# Patient Record
Sex: Female | Born: 1935 | Race: White | Hispanic: No | Marital: Married | Smoking: Never smoker
Health system: Southern US, Community
[De-identification: ages and names within clinical notes are randomized; demographics above are authoritative.]

---

## 1998-08-22 ENCOUNTER — Other Ambulatory Visit: Admission: RE | Admit: 1998-08-22 | Discharge: 1998-08-22 | Payer: Self-pay | Admitting: Obstetrics and Gynecology

## 1999-08-31 ENCOUNTER — Other Ambulatory Visit: Admission: RE | Admit: 1999-08-31 | Discharge: 1999-08-31 | Payer: Self-pay | Admitting: Obstetrics and Gynecology

## 1999-11-18 ENCOUNTER — Encounter: Payer: Self-pay | Admitting: Obstetrics and Gynecology

## 1999-11-18 ENCOUNTER — Encounter: Admission: RE | Admit: 1999-11-18 | Discharge: 1999-11-18 | Payer: Self-pay | Admitting: Obstetrics and Gynecology

## 2000-08-23 ENCOUNTER — Other Ambulatory Visit: Admission: RE | Admit: 2000-08-23 | Discharge: 2000-08-23 | Payer: Self-pay | Admitting: Obstetrics and Gynecology

## 2000-11-23 ENCOUNTER — Encounter: Admission: RE | Admit: 2000-11-23 | Discharge: 2000-11-23 | Payer: Self-pay | Admitting: Obstetrics and Gynecology

## 2000-11-23 ENCOUNTER — Encounter: Payer: Self-pay | Admitting: Obstetrics and Gynecology

## 2001-08-24 ENCOUNTER — Other Ambulatory Visit: Admission: RE | Admit: 2001-08-24 | Discharge: 2001-08-24 | Payer: Self-pay | Admitting: Obstetrics and Gynecology

## 2001-11-24 ENCOUNTER — Encounter: Payer: Self-pay | Admitting: Obstetrics and Gynecology

## 2001-11-24 ENCOUNTER — Encounter: Admission: RE | Admit: 2001-11-24 | Discharge: 2001-11-24 | Payer: Self-pay | Admitting: Obstetrics and Gynecology

## 2001-11-27 ENCOUNTER — Encounter: Admission: RE | Admit: 2001-11-27 | Discharge: 2001-11-27 | Payer: Self-pay | Admitting: Obstetrics and Gynecology

## 2001-11-27 ENCOUNTER — Encounter: Payer: Self-pay | Admitting: Obstetrics and Gynecology

## 2002-03-28 ENCOUNTER — Encounter: Admission: RE | Admit: 2002-03-28 | Discharge: 2002-06-26 | Payer: Self-pay | Admitting: Endocrinology

## 2002-10-09 ENCOUNTER — Other Ambulatory Visit: Admission: RE | Admit: 2002-10-09 | Discharge: 2002-10-09 | Payer: Self-pay | Admitting: Obstetrics and Gynecology

## 2002-11-26 ENCOUNTER — Encounter: Payer: Self-pay | Admitting: Obstetrics and Gynecology

## 2002-11-26 ENCOUNTER — Encounter: Admission: RE | Admit: 2002-11-26 | Discharge: 2002-11-26 | Payer: Self-pay | Admitting: Obstetrics and Gynecology

## 2003-10-10 ENCOUNTER — Other Ambulatory Visit: Admission: RE | Admit: 2003-10-10 | Discharge: 2003-10-10 | Payer: Self-pay | Admitting: Obstetrics and Gynecology

## 2003-11-27 ENCOUNTER — Encounter: Admission: RE | Admit: 2003-11-27 | Discharge: 2003-11-27 | Payer: Self-pay | Admitting: Obstetrics and Gynecology

## 2004-05-05 ENCOUNTER — Ambulatory Visit: Payer: Self-pay | Admitting: Endocrinology

## 2004-10-15 ENCOUNTER — Other Ambulatory Visit: Admission: RE | Admit: 2004-10-15 | Discharge: 2004-10-15 | Payer: Self-pay | Admitting: Addiction Medicine

## 2004-12-02 ENCOUNTER — Encounter: Admission: RE | Admit: 2004-12-02 | Discharge: 2004-12-02 | Payer: Self-pay | Admitting: Obstetrics and Gynecology

## 2005-12-03 ENCOUNTER — Encounter: Admission: RE | Admit: 2005-12-03 | Discharge: 2005-12-03 | Payer: Self-pay | Admitting: Internal Medicine

## 2006-12-06 ENCOUNTER — Encounter: Admission: RE | Admit: 2006-12-06 | Discharge: 2006-12-06 | Payer: Self-pay | Admitting: Internal Medicine

## 2007-01-26 ENCOUNTER — Other Ambulatory Visit: Admission: RE | Admit: 2007-01-26 | Discharge: 2007-01-26 | Payer: Self-pay | Admitting: Obstetrics and Gynecology

## 2007-06-26 ENCOUNTER — Emergency Department (HOSPITAL_COMMUNITY): Admission: EM | Admit: 2007-06-26 | Discharge: 2007-06-26 | Payer: Self-pay | Admitting: Emergency Medicine

## 2007-12-07 ENCOUNTER — Encounter: Admission: RE | Admit: 2007-12-07 | Discharge: 2007-12-07 | Payer: Self-pay | Admitting: Internal Medicine

## 2007-12-12 ENCOUNTER — Encounter: Admission: RE | Admit: 2007-12-12 | Discharge: 2007-12-12 | Payer: Self-pay | Admitting: Internal Medicine

## 2008-12-09 ENCOUNTER — Encounter: Admission: RE | Admit: 2008-12-09 | Discharge: 2008-12-09 | Payer: Self-pay | Admitting: Internal Medicine

## 2009-12-11 ENCOUNTER — Encounter: Admission: RE | Admit: 2009-12-11 | Discharge: 2009-12-11 | Payer: Self-pay | Admitting: Internal Medicine

## 2010-05-03 ENCOUNTER — Encounter: Payer: Self-pay | Admitting: Obstetrics and Gynecology

## 2010-05-04 ENCOUNTER — Encounter: Payer: Self-pay | Admitting: Internal Medicine

## 2010-12-23 ENCOUNTER — Other Ambulatory Visit: Payer: Self-pay | Admitting: Internal Medicine

## 2010-12-23 DIAGNOSIS — Z1231 Encounter for screening mammogram for malignant neoplasm of breast: Secondary | ICD-10-CM

## 2011-01-06 ENCOUNTER — Ambulatory Visit
Admission: RE | Admit: 2011-01-06 | Discharge: 2011-01-06 | Disposition: A | Discharge status: Home / Self Care | Payer: Medicare Other | Source: Ambulatory Visit | Attending: Internal Medicine | Admitting: Internal Medicine

## 2011-01-06 DIAGNOSIS — Z1231 Encounter for screening mammogram for malignant neoplasm of breast: Secondary | ICD-10-CM

## 2011-12-20 ENCOUNTER — Other Ambulatory Visit: Payer: Self-pay | Admitting: Internal Medicine

## 2011-12-20 DIAGNOSIS — Z1231 Encounter for screening mammogram for malignant neoplasm of breast: Secondary | ICD-10-CM

## 2012-01-11 ENCOUNTER — Ambulatory Visit
Admission: RE | Admit: 2012-01-11 | Discharge: 2012-01-11 | Disposition: A | Discharge status: Home / Self Care | Payer: Medicare Other | Source: Ambulatory Visit | Attending: Internal Medicine | Admitting: Internal Medicine

## 2012-01-11 DIAGNOSIS — Z1231 Encounter for screening mammogram for malignant neoplasm of breast: Secondary | ICD-10-CM

## 2012-12-19 ENCOUNTER — Other Ambulatory Visit: Payer: Self-pay

## 2012-12-19 DIAGNOSIS — Z1231 Encounter for screening mammogram for malignant neoplasm of breast: Secondary | ICD-10-CM

## 2013-01-16 ENCOUNTER — Ambulatory Visit
Admission: RE | Admit: 2013-01-16 | Discharge: 2013-01-16 | Disposition: A | Discharge status: Home / Self Care | Payer: Medicare Other | Source: Ambulatory Visit

## 2013-01-16 DIAGNOSIS — Z1231 Encounter for screening mammogram for malignant neoplasm of breast: Secondary | ICD-10-CM

## 2013-12-18 ENCOUNTER — Other Ambulatory Visit: Payer: Self-pay

## 2013-12-18 DIAGNOSIS — Z1231 Encounter for screening mammogram for malignant neoplasm of breast: Secondary | ICD-10-CM

## 2014-01-17 ENCOUNTER — Ambulatory Visit
Admission: RE | Admit: 2014-01-17 | Discharge: 2014-01-17 | Disposition: A | Discharge status: Home / Self Care | Payer: Medicare Other | Source: Ambulatory Visit

## 2014-01-17 DIAGNOSIS — Z1231 Encounter for screening mammogram for malignant neoplasm of breast: Secondary | ICD-10-CM

## 2014-12-30 ENCOUNTER — Other Ambulatory Visit: Payer: Self-pay

## 2014-12-30 DIAGNOSIS — Z1231 Encounter for screening mammogram for malignant neoplasm of breast: Secondary | ICD-10-CM

## 2015-01-22 ENCOUNTER — Ambulatory Visit
Admission: RE | Admit: 2015-01-22 | Discharge: 2015-01-22 | Disposition: A | Discharge status: Home / Self Care | Payer: Medicare Other | Source: Ambulatory Visit

## 2015-01-22 DIAGNOSIS — Z1231 Encounter for screening mammogram for malignant neoplasm of breast: Secondary | ICD-10-CM

## 2015-12-22 ENCOUNTER — Other Ambulatory Visit: Payer: Self-pay | Admitting: Internal Medicine

## 2015-12-22 DIAGNOSIS — Z1231 Encounter for screening mammogram for malignant neoplasm of breast: Secondary | ICD-10-CM

## 2016-01-26 ENCOUNTER — Ambulatory Visit
Admission: RE | Admit: 2016-01-26 | Discharge: 2016-01-26 | Disposition: A | Discharge status: Home / Self Care | Payer: Medicare Other | Source: Ambulatory Visit | Attending: Internal Medicine | Admitting: Internal Medicine

## 2016-01-26 DIAGNOSIS — Z1231 Encounter for screening mammogram for malignant neoplasm of breast: Secondary | ICD-10-CM

## 2016-10-16 ENCOUNTER — Encounter (HOSPITAL_COMMUNITY): Payer: Self-pay | Admitting: Nurse Practitioner

## 2016-10-16 ENCOUNTER — Emergency Department (HOSPITAL_COMMUNITY)
Admission: EM | Admit: 2016-10-16 | Discharge: 2016-10-16 | Disposition: A | Discharge status: Home / Self Care | Payer: Medicare Other | Attending: Emergency Medicine | Admitting: Emergency Medicine

## 2016-10-16 DIAGNOSIS — L299 Pruritus, unspecified: Secondary | ICD-10-CM | POA: Diagnosis not present

## 2016-10-16 DIAGNOSIS — Z7982 Long term (current) use of aspirin: Secondary | ICD-10-CM | POA: Diagnosis not present

## 2016-10-16 DIAGNOSIS — Z79899 Other long term (current) drug therapy: Secondary | ICD-10-CM | POA: Insufficient documentation

## 2016-10-16 DIAGNOSIS — L500 Allergic urticaria: Secondary | ICD-10-CM | POA: Diagnosis not present

## 2016-10-16 DIAGNOSIS — T7840XA Allergy, unspecified, initial encounter: Secondary | ICD-10-CM

## 2016-10-16 DIAGNOSIS — R21 Rash and other nonspecific skin eruption: Secondary | ICD-10-CM | POA: Diagnosis present

## 2016-10-16 DIAGNOSIS — L509 Urticaria, unspecified: Secondary | ICD-10-CM

## 2016-10-16 DIAGNOSIS — R490 Dysphonia: Secondary | ICD-10-CM | POA: Insufficient documentation

## 2016-10-16 MED ORDER — DIPHENHYDRAMINE HCL 12.5 MG/5ML PO ELIX: 25.0000 mg | ORAL_SOLUTION | Freq: Once | ORAL | Status: DC

## 2016-10-16 MED ORDER — DIPHENHYDRAMINE HCL 50 MG/ML IJ SOLN
25.0000 mg | Freq: Once | INTRAMUSCULAR | Status: AC
  Administered 2016-10-16: 25 mg via INTRAVENOUS
  Filled 2016-10-16: qty 1

## 2016-10-16 MED ORDER — DIPHENHYDRAMINE HCL 25 MG PO CAPS: 25.0000 mg | ORAL_CAPSULE | Freq: Four times a day (QID) | ORAL | 0 refills | Status: DC | PRN

## 2016-10-16 MED ORDER — FAMOTIDINE IN NACL 20-0.9 MG/50ML-% IV SOLN
20.0000 mg | Freq: Once | INTRAVENOUS | Status: AC
  Administered 2016-10-16: 20 mg via INTRAVENOUS
  Filled 2016-10-16: qty 50

## 2016-10-16 MED ORDER — PREDNISONE 10 MG PO TABS: 60.0000 mg | ORAL_TABLET | Freq: Every day | ORAL | 0 refills | Status: DC

## 2016-10-16 MED ORDER — METHYLPREDNISOLONE SODIUM SUCC 125 MG IJ SOLR
125.0000 mg | Freq: Once | INTRAMUSCULAR | Status: AC
  Administered 2016-10-16: 125 mg via INTRAVENOUS
  Filled 2016-10-16: qty 2

## 2016-10-16 NOTE — Discharge Instructions (Signed)
We saw you in the ER after you had the allergic reaction.  The reaction is severe, however, it appears to be in control and there is no increased swelling or any difficulty in breathing noted. We are not sure what caused the reaction, and it is important for you to follow up with an allergist. Please take the medications prescribed. PLEASE RETURN TO THE ER IMMEDIATELY IN CASE YOU START HAVING WORSENING SWELLING, DIFFICULTY IN BREATHING ETC.  

## 2016-10-16 NOTE — ED Triage Notes (Signed)
Pt states while laying in bed, she noticed she had been bitten by unknown insect at which point her skin turned red and flushed. She feel warm to touch and the skin color changes is rapidly progressing to lower extremities.

## 2016-10-16 NOTE — ED Provider Notes (Signed)
[facility]-EMERGENCY DEPT Provider Note   CSN: [id number] Arrival date & time: 10/16/16  0214   By signing my name below, I, Lori Valdez, attest that this documentation has been prepared under the direction and in the presence of Lori Kaplan, MD. Electronically Signed: Soijett Valdez, ED Scribe. 10/16/16. 2:45 AM.  History   Chief Complaint Chief Complaint  Patient presents with  . Allergic Reaction  . Insect Bite    HPI Lori Valdez is a 83 y.o. female who presents to the Emergency Department complaining of allergic reaction s/p possible insshect bite onset 1.5 hours ago. Pt reports associated hoarse voice, rash to generalized body, and redness to the affected areas. Pt has tried OTC anti-itch cream and vinegar with no relief of her symptoms. She notes that she woke up and noticed itching, redness, and a rash to her entire body. Denies hx of allergic reactions in the past. She denies trouble swallowing, nausea, SOB, wheezing, diaphoresis, and any other symptoms. Denies PMHx of DM.     The history is provided by the patient and the spouse. No language interpreter was used.    History reviewed. No pertinent past medical history.  There are no active problems to display for this patient.   History reviewed. No pertinent surgical history.  OB History    No data available       Home Medications    Prior to Admission medications   Medication Sig Start Date End Date Taking? Authorizing Provider  aspirin 81 MG chewable tablet Chew 81 mg by mouth every other day.    Yes [provider]  Multiple Vitamin (MULTIVITAMIN WITH MINERALS) TABS tablet Take 1 tablet by mouth daily.   Yes [provider]  Omega-3 Fatty Acids (FISH OIL) 1000 MG CAPS Take 1,000 mg by mouth daily.    Yes [provider]  simvastatin (ZOCOR) 20 MG tablet Take 20 mg by mouth daily at 6 PM.  09/06/16  Yes [provider]  diphenhydrAMINE (BENADRYL) 25 mg capsule Take 1 capsule  (25 mg total) by mouth every 6 (six) hours as needed for itching. 10/16/16   Lori Kaplan, MD  predniSONE (DELTASONE) 10 MG tablet Take 6 tablets (60 mg total) by mouth daily. 10/16/16   Lori Kaplan, MD    Family History History reviewed. No pertinent family history.  Social History Social History  Substance Use Topics  . Smoking status: Not on file  . Smokeless tobacco: Not on file  . Alcohol use Not on file     Allergies   Patient has no known allergies.   Review of Systems Review of Systems  HENT: Positive for voice change (hoarse). Negative for trouble swallowing.   Respiratory: Negative for shortness of breath and wheezing.   Gastrointestinal: Negative for nausea.  Skin: Positive for color change (redness to the affected areas) and rash (generalized body).  All other systems reviewed and are negative.    Physical Exam Updated Vital Signs BP 130/80 (BP Location: Left Arm)   Pulse 84   Temp 97.9 F (36.6 C) (Oral)   Resp 18   SpO2 96%   Physical Exam  Constitutional: She is oriented to person, place, and time. She appears well-developed and well-nourished. No distress.  HENT:  Head: Normocephalic and atraumatic.  No oral swelling appreciated.   Eyes: EOM are normal.  Neck: Neck supple.  Cardiovascular: Normal rate, regular rhythm and normal heart sounds.  Exam reveals no gallop and no friction rub.   No  murmur heard. Pulmonary/Chest: Effort normal and breath sounds normal. No respiratory distress. She has no wheezes. She has no rales.  Abdominal: Soft. Bowel sounds are normal. She exhibits no distension. There is no tenderness.  Musculoskeletal: Normal range of motion.  Neurological: She is alert and oriented to person, place, and time.  Skin: Skin is warm and dry. Rash noted. Rash is urticarial. There is erythema.  Urticarial, erythematous, blanching rash diffusely present over neck, torso, and proximal upper and lower extremities.   Psychiatric: She has a  normal mood and affect. Her behavior is normal.  Nursing note and vitals reviewed.    ED Treatments / Results  DIAGNOSTIC STUDIES: Oxygen Saturation is 96% on RA, nl by my interpretation.    COORDINATION OF CARE: 2:39 AM Discussed treatment plan with pt at bedside and pt agreed to plan.   Labs (all labs ordered are listed, but only abnormal results are displayed) Labs Reviewed - No data to display  EKG  EKG Interpretation None       Radiology No results found.  Procedures Procedures (including critical care time)  Medications Ordered in ED Medications  famotidine (PEPCID) IVPB 20 mg premix (0 mg Intravenous Stopped 10/16/16 0359)  methylPREDNISolone sodium succinate (SOLU-MEDROL) 125 mg/2 mL injection 125 mg (125 mg Intravenous Given 10/16/16 0324)  diphenhydrAMINE (BENADRYL) injection 25 mg (25 mg Intravenous Given 10/16/16 0324)     Initial Impression / Assessment and Plan / ED Course  I have reviewed the triage vital signs and the nursing notes.  Pertinent labs & imaging results that were available during my care of the patient were reviewed by me and considered in my medical decision making (see chart for details).  Clinical Course as of Oct 17 722  Sat Oct 16, 2016  0600 Pt at reassessment had complete clearance of her rash and she continues to have no dib, wheezing, dysphagia.. Will d/c with steroids. Strict ER return precautions have been discussed, and patient is agreeing with the plan and is comfortable with the workup done and the recommendations from the ER.   [AN]    Clinical Course User Index [AN] Lori KaplanNanavati, Maycen Degregory, MD    Pt comes in with cc of itching and skin rash. Pt has an urticarial rash, generalized and she feels that her voice is slightly hoarse - although she has no dib, dysphagia and no wheezing. Oral exam is normal. We will give steroids and benadryl and reassess.    Final Clinical Impressions(s) / ED Diagnoses   Final diagnoses:  Urticaria   Allergic reaction, initial encounter    New Prescriptions Discharge Medication List as of 10/16/2016  6:04 AM    START taking these medications   Details  diphenhydrAMINE (BENADRYL) 25 mg capsule Take 1 capsule (25 mg total) by mouth every 6 (six) hours as needed for itching., Starting Sat 10/16/2016, Print    predniSONE (DELTASONE) 10 MG tablet Take 6 tablets (60 mg total) by mouth daily., Starting Sat 10/16/2016, Print       I personally performed the services described in this documentation, which was scribed in my presence. The recorded information has been reviewed and is accurate.     Lori KaplanNanavati, Jlynn Langille, MD 10/16/16 [phone]

## 2016-10-16 NOTE — ED Notes (Signed)
PT DISCHARGED. INSTRUCTIONS AND PRESCRIPTIONS GIVEN. AAOX4. PT IN NO APPARENT DISTRESS OR PAIN. THE OPPORTUNITY TO ASK QUESTIONS WAS PROVIDED. 

## 2016-12-15 LAB — LIPID PANEL
CHOLESTEROL: 213 — AB (ref 0–200)
HDL: 77 — AB (ref 35–70)
LDL Cholesterol: 22
TRIGLYCERIDES: 111 (ref 40–160)

## 2016-12-15 LAB — BASIC METABOLIC PANEL
BUN: 12 (ref 4–21)
CREATININE: 0.9 (ref 0.5–1.1)
Glucose: 147
Potassium: 5 (ref 3.4–5.3)
Sodium: 143 (ref 137–147)

## 2016-12-15 LAB — CBC AND DIFFERENTIAL
HCT: 43 (ref 36–46)
HEMOGLOBIN: 13.8 (ref 12.0–16.0)
Platelets: 274 (ref 150–399)
WBC: 4.5

## 2016-12-15 LAB — HEPATIC FUNCTION PANEL
ALT: 17 (ref 7–35)
AST: 23 (ref 13–35)
Alkaline Phosphatase: 74 (ref 25–125)
Bilirubin, Total: 0.5

## 2016-12-15 LAB — HEMOGLOBIN A1C: HEMOGLOBIN A1C: 7.3 — AB (ref 4.0–6.0)

## 2016-12-20 ENCOUNTER — Other Ambulatory Visit: Payer: Self-pay | Admitting: Internal Medicine

## 2016-12-20 DIAGNOSIS — Z1231 Encounter for screening mammogram for malignant neoplasm of breast: Secondary | ICD-10-CM

## 2017-01-27 ENCOUNTER — Ambulatory Visit
Admission: RE | Admit: 2017-01-27 | Discharge: 2017-01-27 | Disposition: A | Discharge status: Home / Self Care | Payer: Medicare Other | Source: Ambulatory Visit | Attending: Internal Medicine | Admitting: Internal Medicine

## 2017-01-27 DIAGNOSIS — Z1231 Encounter for screening mammogram for malignant neoplasm of breast: Secondary | ICD-10-CM

## 2017-01-27 LAB — HM MAMMOGRAPHY

## 2017-12-20 ENCOUNTER — Ambulatory Visit (INDEPENDENT_AMBULATORY_CARE_PROVIDER_SITE_OTHER): Payer: Medicare Other | Admitting: Family Medicine

## 2017-12-20 ENCOUNTER — Ambulatory Visit: Payer: Medicare Other | Admitting: Family Medicine

## 2017-12-20 ENCOUNTER — Encounter: Payer: Self-pay | Admitting: Family Medicine

## 2017-12-20 VITALS — BP 112/68 | HR 64 | Temp 97.5°F | Ht 63.0 in | Wt 118.2 lb

## 2017-12-20 DIAGNOSIS — E119 Type 2 diabetes mellitus without complications: Secondary | ICD-10-CM | POA: Diagnosis not present

## 2017-12-20 DIAGNOSIS — E7800 Pure hypercholesterolemia, unspecified: Secondary | ICD-10-CM

## 2017-12-20 LAB — HEMOGLOBIN A1C: Hgb A1c MFr Bld: 7.4 % — ABNORMAL HIGH (ref 4.6–6.5)

## 2017-12-20 LAB — URINALYSIS, ROUTINE W REFLEX MICROSCOPIC
Bilirubin Urine: NEGATIVE
Hgb urine dipstick: NEGATIVE
Ketones, ur: NEGATIVE
Leukocytes, UA: NEGATIVE
Nitrite: NEGATIVE
RBC / HPF: NONE SEEN (ref 0–?)
Specific Gravity, Urine: <=1.005 — AB (ref 1.000–1.030)
Total Protein, Urine: NEGATIVE
Urine Glucose: NEGATIVE
Urobilinogen, UA: 0.2 (ref 0.0–1.0)
pH: 6.5 (ref 5.0–8.0)

## 2017-12-20 LAB — COMPREHENSIVE METABOLIC PANEL WITH GFR
ALT: 21 U/L (ref 0–35)
AST: 20 U/L (ref 0–37)
Albumin: 4.5 g/dL (ref 3.5–5.2)
Alkaline Phosphatase: 65 U/L (ref 39–117)
BUN: 14 mg/dL (ref 6–23)
CO2: 28 meq/L (ref 19–32)
Calcium: 9.8 mg/dL (ref 8.4–10.5)
Chloride: 104 meq/L (ref 96–112)
Creatinine, Ser: 0.78 mg/dL (ref 0.40–1.20)
GFR: 74.99 mL/min (ref >60.00)
Glucose, Bld: 121 mg/dL — ABNORMAL HIGH (ref 70–99)
Potassium: 4.2 meq/L (ref 3.5–5.1)
Sodium: 139 meq/L (ref 135–145)
Total Bilirubin: 0.6 mg/dL (ref 0.2–1.2)
Total Protein: 7.3 g/dL (ref 6.0–8.3)

## 2017-12-20 LAB — MICROALBUMIN / CREATININE URINE RATIO
Creatinine,U: 29.7 mg/dL
Microalb Creat Ratio: 2.4 mg/g (ref 0.0–30.0)
Microalb, Ur: <0.7 mg/dL (ref 0.0–1.9)

## 2017-12-20 LAB — CBC
HEMATOCRIT: 39.7 % (ref 36.0–46.0)
HEMOGLOBIN: 13.3 g/dL (ref 12.0–15.0)
MCHC: 33.4 g/dL (ref 30.0–36.0)
MCV: 95.2 fl (ref 78.0–100.0)
Platelets: 228 10*3/uL (ref 150.0–400.0)
RBC: 4.17 Mil/uL (ref 3.87–5.11)
RDW: 12.9 % (ref 11.5–15.5)
WBC: 4.7 10*3/uL (ref 4.0–10.5)

## 2017-12-20 LAB — LIPID PANEL
Cholesterol: 213 mg/dL — ABNORMAL HIGH (ref 0–200)
HDL: 67.2 mg/dL (ref >39.00)
LDL Cholesterol: 117 mg/dL — ABNORMAL HIGH (ref 0–99)
NonHDL: 146.07
Total CHOL/HDL Ratio: 3
Triglycerides: 144 mg/dL (ref 0.0–149.0)
VLDL: 28.8 mg/dL (ref 0.0–40.0)

## 2017-12-20 NOTE — Patient Instructions (Signed)
Type 2 Diabetes Mellitus, Self Care, Adult Caring for yourself after you have been diagnosed with type 2 diabetes (type 2 diabetes mellitus) means keeping your blood sugar (glucose) under control with a balance of:  Nutrition.  Exercise.  Lifestyle changes.  Medicines or insulin, if necessary.  Support from your team of health care providers and others.  The following information explains what you need to know to manage your diabetes at home. What do I need to do to manage my blood glucose?  Check your blood glucose every day, as often as told by your health care provider.  Contact your health care provider if your blood glucose is above your target for 2 tests in a row.  Have your A1c (hemoglobin A1c) level checked at least two times a year, or as often as told by your health care provider. Your health care provider will set individualized treatment goals for you. Generally, the goal of treatment is to maintain the following blood glucose levels:  Before meals (preprandial): 80-130 mg/dL (4.4-7.2 mmol/L).  After meals (postprandial): below 180 mg/dL (10 mmol/L).  A1c level: less than 7%.  What do I need to know about hyperglycemia and hypoglycemia? What is hyperglycemia? Hyperglycemia, also called high blood glucose, occurs when blood glucose is too high.Make sure you know the early signs of hyperglycemia, such as:  Increased thirst.  Hunger.  Feeling very tired.  Needing to urinate more often than usual.  Blurry vision.  What is hypoglycemia? Hypoglycemia, also called low blood glucose, occurswith a blood glucose level at or below 70 mg/dL (3.9 mmol/L). The risk for hypoglycemia increases during or after exercise, during sleep, during illness, and when skipping meals or not eating for a long time (fasting). It is important to know the symptoms of hypoglycemia and treat it right away. Always have a 15-gram rapid-acting carbohydrate snack with you to treat low blood  glucose. Family members and close friends should also know the symptoms and should understand how to treat hypoglycemia, in case you are not able to treat yourself. What are the symptoms of hypoglycemia? Hypoglycemia symptoms can include:  Hunger.  Anxiety.  Sweating and feeling clammy.  Confusion.  Dizziness or feeling light-headed.  Sleepiness.  Nausea.  Increased heart rate.  Headache.  Blurry vision.  Seizure.  Nightmares.  Tingling or numbness around the mouth, lips, or tongue.  A change in speech.  Decreased ability to concentrate.  A change in coordination.  Restless sleep.  Tremors or shakes.  Fainting.  Irritability.  How do I treat hypoglycemia?  If you are alert and able to swallow safely, follow the 15:15 rule:  Take 15 grams of a rapid-acting carbohydrate. Rapid-acting options include: ? 1 tube of glucose gel. ? 3 glucose pills. ? 6-8 pieces of hard candy. ? 4 oz (120 mL) of fruit juice. ? 4 oz (120 mL) of regular (not diet) soda.  Check your blood glucose 15 minutes after you take the carbohydrate.  If the repeat blood glucose level is still at or below 70 mg/dL (3.9 mmol/L), take 15 grams of a carbohydrate again.  If your blood glucose level does not increase above 70 mg/dL (3.9 mmol/L) after 3 tries, seek emergency medical care.  After your blood glucose level returns to normal, eat a meal or a snack within 1 hour.  How do I treat severe hypoglycemia? Severe hypoglycemia is when your blood glucose level is at or below 54 mg/dL (3 mmol/L). Severe hypoglycemia is an emergency. Do not  wait to see if the symptoms will go away. Get medical help right away. Call your local emergency services (911 in the [place] Do not drive yourself to the hospital. If you have severe hypoglycemia and you cannot eat or drink, you may need an injection of glucagon. A family member or close friend should learn how to check your blood glucose and how to give you  a glucagon injection. Ask your health care provider if you need to have an emergency glucagon injection kit available. Severe hypoglycemia may need to be treated in a hospital. The treatment may include getting glucose through an IV tube. You may also need treatment for the cause of your hypoglycemia. Can having diabetes put me at risk for other conditions? Having diabetes can put you at risk for other long-term (chronic) conditions, such as heart disease and kidney disease. Your health care provider may prescribe medicines to help prevent complications from diabetes. These medicines may include:  Aspirin.  Medicine to lower cholesterol.  Medicine to control blood pressure.  What else can I do to manage my diabetes? Take your diabetes medicines as told  If your health care provider prescribed insulin or diabetes medicines, take them every day.  Do not run out of insulin or other diabetes medicines that you take. Plan ahead so you always have these available.  If you use insulin, adjust your dosage based on how physically active you are and what foods you eat. Your health care provider will tell you how to adjust your dosage. Make healthy food choices  The things that you eat and drink affect your blood glucose and your insulin dosage. Making good choices helps to control your diabetes and prevent other health problems. A healthy meal plan includes eating lean proteins, complex carbohydrates, fresh fruits and vegetables, low-fat dairy products, and healthy fats. Make an appointment to see a diet and nutrition specialist (registered dietitian) to help you create an eating plan that is right for you. Make sure that you:  Follow instructions from your health care provider about eating or drinking restrictions.  Drink enough fluid to keep your urine clear or pale yellow.  Eat healthy snacks between nutritious meals.  Track the carbohydrates that you eat. Do this by reading food labels and  learning the standard serving sizes of foods.  Follow your sick day plan whenever you cannot eat or drink as usual. Make this plan in advance with your health care provider.  Stay active  Exercise regularly, as told by your health care provider. This may include:  Stretching and doing strength exercises, such as yoga or weightlifting, at least 2 times a week.  Doing at least 150 minutes of moderate-intensity or vigorous-intensity exercise each week. This could be brisk walking, biking, or water aerobics. ? Spread out your activity over at least 3 days of the week. ? Do not go more than 2 days in a row without doing some kind of physical activity.  When you start a new exercise or activity, work with your health care provider to adjust your insulin, medicines, or food intake as needed. Make healthy lifestyle choices  Do not use any tobacco products, such as cigarettes, chewing tobacco, and e-cigarettes. If you need help quitting, ask your health care provider.  If your health care provider says that alcohol is safe for you, limit alcohol intake to no more than 1 drink per day for nonpregnant women and 2 drinks per day for men. One drink equals 12 oz of  beer, 5 oz of wine, or 1 oz of hard liquor.  Learn to manage stress. If you need help with this, ask your health care provider. Care for your body   Keep your immunizations up to date. In addition to getting vaccinations as told by your health care provider, it is recommended that you get vaccinated against the following illnesses: ? The flu (influenza). Get a flu shot every year. ? Pneumonia. ? Hepatitis B.  Schedule an eye exam soon after your diagnosis, and then one time every year after that.  Check your skin and feet every day for cuts, bruises, redness, blisters, or sores. Schedule a foot exam with your health care provider once every year.  Brush your teeth and gums two times a day, and floss at least one time a day. Visit your  dentist at least once every 6 months.  Maintain a healthy weight. General instructions  Take over-the-counter and prescription medicines only as told by your health care provider.  Share your diabetes management plan with people in your workplace, school, and household.  Check your urine for ketones when you are ill and as told by your health care provider.  Ask your health care provider: ? Do I need to meet with a diabetes educator? ? Where can I find a support group for people with diabetes?  Carry a medical alert card or wear medical alert jewelry.  Keep all follow-up visits as told by your health care provider. This is important. Where to find more information: For more information about diabetes, visit:  American Diabetes Association (ADA): www.diabetes.org  American Association of Diabetes Educators (AADE): www.diabeteseducator.org/patient-resources  This information is not intended to replace advice given to you by your health care provider. Make sure you discuss any questions you have with your health care provider. Document Released: 07/21/2015 Document Revised: 09/04/2015 Document Reviewed: 05/02/2015 Elsevier Interactive Patient Education  [employer].

## 2017-12-20 NOTE — Progress Notes (Signed)
Subjective:  Patient ID: Lori Valdez, female    DOB: 01-Jul-1932  Age: 84 y.o. MRN: [id number]  CC: Establish Care (CPE, not fasting ( light breakfast))   HPI Amore Ackman Garverick presents for follow-up of her elevated cholesterol.  She is taking Zocor for years.  Her mother had high cholesterol.  She is tolerating this okay her core and does take it in the evening.  She brings in labs from last year that showed an LDL of 111 and an HDL of 77.  He has never had a heart attack or stroke hemoglobin A1c was elevated with that lab work.  She has no prior history of diabetes.  She is a retired Dietitian and stays active by going to a [employer] and doing exercise classes.  She is been her entire career working at the [employer] in [place].  She is married and her husband is a patient of mine as well.  She does not share his same last name.  She has 2 alcoholic drinks with her husband nightly.  Outpatient Medications Prior to Visit  Medication Sig Dispense Refill  . diphenhydrAMINE (BENADRYL) 25 mg capsule Take 1 capsule (25 mg total) by mouth every 6 (six) hours as needed for itching. 30 capsule 0  . Multiple Vitamin (MULTIVITAMIN WITH MINERALS) TABS tablet Take 1 tablet by mouth daily.    . Omega-3 Fatty Acids (FISH OIL) 1000 MG CAPS Take 1,000 mg by mouth daily.     . simvastatin (ZOCOR) 20 MG tablet Take 20 mg by mouth daily at 6 PM.     . aspirin 81 MG chewable tablet Chew 81 mg by mouth every other day.     . predniSONE (DELTASONE) 10 MG tablet Take 6 tablets (60 mg total) by mouth daily. (Patient not taking: Reported on 12/20/2017) 18 tablet 0  . simvastatin (ZOCOR) 10 MG tablet Take by mouth.     No facility-administered medications prior to visit.     ROS Review of Systems  Constitutional: Negative for chills, fatigue, fever and unexpected weight change.  HENT: Negative.   Eyes: Negative.   Respiratory: Negative.   Cardiovascular: Negative.     Gastrointestinal: Negative.   Endocrine: Negative for polyphagia and polyuria.  Genitourinary: Negative.   Musculoskeletal: Negative for gait problem and joint swelling.  Skin: Negative for rash.  Allergic/Immunologic: Negative for immunocompromised state.  Neurological: Negative.   Hematological: Negative.   Psychiatric/Behavioral: Negative.     Objective:  BP 112/68 (BP Location: Left Arm, Patient Position: Sitting, Cuff Size: Normal)   Pulse 64   Temp (!) 97.5 F (36.4 C) (Oral)   Ht 5\' 3"  (1.6 m)   Wt 118 lb 3.2 oz (53.6 kg)   SpO2 95%   BMI 20.94 kg/m   BP Readings from Last 3 Encounters:  12/20/17 112/68  10/16/16 138/83    Wt Readings from Last 3 Encounters:  12/20/17 118 lb 3.2 oz (53.6 kg)  10/16/16 118 lb (53.5 kg)    Physical Exam  Constitutional: She is oriented to person, place, and time. She appears well-developed and well-nourished. No distress.  HENT:  Head: Normocephalic and atraumatic.  Right Ear: External ear normal.  Left Ear: External ear normal.  Mouth/Throat: Oropharynx is clear and moist. No oropharyngeal exudate.  Eyes: Pupils are equal, round, and reactive to light. Conjunctivae and EOM are normal. Right eye exhibits no discharge. Left eye exhibits no discharge. No scleral icterus.  Neck: No  JVD present. No tracheal deviation present.  Cardiovascular: Normal rate, regular rhythm and normal heart sounds.  Pulmonary/Chest: Effort normal and breath sounds normal.  Neurological: She is alert and oriented to person, place, and time.  Skin: Skin is warm and dry. She is not diaphoretic.  Psychiatric: She has a normal mood and affect. Her behavior is normal.    Lab Results  Component Value Date   WBC 4.7 12/20/2017   HGB 13.3 12/20/2017   HCT 39.7 12/20/2017   PLT 228.0 12/20/2017   GLUCOSE 121 (H) 12/20/2017   CHOL 213 (H) 12/20/2017   TRIG 144.0 12/20/2017   HDL 67.20 12/20/2017   LDLCALC 117 (H) 12/20/2017   ALT 21 12/20/2017   AST 20  12/20/2017   NA 139 12/20/2017   K 4.2 12/20/2017   CL 104 12/20/2017   CREATININE 0.78 12/20/2017   BUN 14 12/20/2017   CO2 28 12/20/2017   HGBA1C 7.4 (H) 12/20/2017   MICROALBUR <0.7 12/20/2017    Mm Digital Screening Bilateral  Result Date: 01/27/2017 CLINICAL DATA:  Screening. EXAM: DIGITAL SCREENING BILATERAL MAMMOGRAM WITH CAD COMPARISON:  Previous exam(s). ACR Breast Density Category c: The breast tissue is heterogeneously dense, which may obscure small masses. FINDINGS: There are no findings suspicious for malignancy. Images were processed with CAD. IMPRESSION: No mammographic evidence of malignancy. A result letter of this screening mammogram will be mailed directly to the patient. RECOMMENDATION: Screening mammogram in one year. (Code:SM-B-01Y) BI-RADS CATEGORY  1: Negative. Electronically Signed   By: Ted Mcalpine M.D.   On: 01/27/2017 13:25    Assessment & Plan:   Myers was seen today for establish care.  Diagnoses and all orders for this visit:  Elevated LDL cholesterol level -     Comprehensive metabolic panel -     Lipid panel  Diabetes mellitus without complication (HCC) -     CBC -     Comprehensive metabolic panel -     Hemoglobin A1c -     Microalbumin / creatinine urine ratio -     Urinalysis, Routine w reflex microscopic -     metFORMIN (GLUCOPHAGE-XR) 500 MG 24 hr tablet; Take 1 tablet (500 mg total) by mouth at bedtime. -     Ambulatory referral to diabetic education   I am having Annalyce C. Rump start on metFORMIN. I am also having her maintain her simvastatin, aspirin, Fish Oil, multivitamin with minerals, diphenhydrAMINE, predniSONE, and simvastatin.  Meds ordered this encounter  Medications  . metFORMIN (GLUCOPHAGE-XR) 500 MG 24 hr tablet    Sig: Take 1 tablet (500 mg total) by mouth at bedtime.    Dispense:  90 tablet    Refill:  1   He is 3 hours fasting today.  Discussed using Glucophage the persistently elevated hemoglobin A1c.   Follow-up will be in 3 months.  Anticipatory guidance was given about diabetes.  Patient rarely consumes sweets and does not have sweet sugary drinks.  Follow-up: Return in about 3 months (around 03/21/2018).  Mliss Sax, MD

## 2017-12-21 MED ORDER — METFORMIN HCL ER 500 MG PO TB24: 500.0000 mg | ORAL_TABLET | Freq: Every day | ORAL | 1 refills | Status: DC

## 2017-12-27 ENCOUNTER — Encounter: Payer: Self-pay | Admitting: Family Medicine

## 2017-12-29 ENCOUNTER — Other Ambulatory Visit: Payer: Self-pay | Admitting: Family Medicine

## 2017-12-29 DIAGNOSIS — Z1231 Encounter for screening mammogram for malignant neoplasm of breast: Secondary | ICD-10-CM

## 2018-02-01 ENCOUNTER — Ambulatory Visit
Admission: RE | Admit: 2018-02-01 | Discharge: 2018-02-01 | Disposition: A | Discharge status: Home / Self Care | Payer: Medicare Other | Source: Ambulatory Visit | Attending: Family Medicine | Admitting: Family Medicine

## 2018-02-01 DIAGNOSIS — Z1231 Encounter for screening mammogram for malignant neoplasm of breast: Secondary | ICD-10-CM

## 2018-03-02 ENCOUNTER — Ambulatory Visit (INDEPENDENT_AMBULATORY_CARE_PROVIDER_SITE_OTHER): Payer: Medicare Other | Admitting: Behavioral Health

## 2018-03-02 DIAGNOSIS — Z23 Encounter for immunization: Secondary | ICD-10-CM | POA: Diagnosis not present

## 2018-03-02 NOTE — Progress Notes (Signed)
Patient came in clinic today for Influenza vaccination. IM injection was given in the right deltoid. Patient tolerated the injection well. No s/s of a reaction were noted prior to patient leaving the nurse visit.

## 2018-05-10 ENCOUNTER — Encounter: Payer: Self-pay | Admitting: Family Medicine

## 2018-05-11 MED ORDER — SIMVASTATIN 20 MG PO TABS: 20.0000 mg | ORAL_TABLET | Freq: Every day | ORAL | 2 refills | Status: DC

## 2018-07-11 ENCOUNTER — Other Ambulatory Visit: Payer: Self-pay | Admitting: Family Medicine

## 2018-07-11 DIAGNOSIS — E119 Type 2 diabetes mellitus without complications: Secondary | ICD-10-CM

## 2018-12-06 ENCOUNTER — Encounter: Payer: Self-pay | Admitting: Family Medicine

## 2018-12-09 ENCOUNTER — Encounter: Payer: Self-pay | Admitting: Family Medicine

## 2018-12-11 ENCOUNTER — Ambulatory Visit (INDEPENDENT_AMBULATORY_CARE_PROVIDER_SITE_OTHER): Payer: Medicare Other | Admitting: Family Medicine

## 2018-12-11 ENCOUNTER — Other Ambulatory Visit: Payer: Self-pay

## 2018-12-11 ENCOUNTER — Encounter: Payer: Self-pay | Admitting: Family Medicine

## 2018-12-11 DIAGNOSIS — Z20822 Contact with and (suspected) exposure to covid-19: Secondary | ICD-10-CM

## 2018-12-11 DIAGNOSIS — R509 Fever, unspecified: Secondary | ICD-10-CM

## 2018-12-11 DIAGNOSIS — Z20828 Contact with and (suspected) exposure to other viral communicable diseases: Secondary | ICD-10-CM | POA: Diagnosis not present

## 2018-12-11 NOTE — Progress Notes (Signed)
Established Patient Office Visit  Subjective:  Patient ID: Lori Valdez, female    DOB: Jan 23, 1933  Age: 85 y.o. MRN: [id number]  CC:  Chief Complaint  Patient presents with  . Fever    HPI Kadajah Kjos Spangler presents for evaluation and treatment of a 3-day history of elevated temperature with fatigue.  Patient denies any respiratory tract symptoms to include nasal congestion postnasal drip cough or shortness of breath.  Her smell and taste senses remain intact.  She is having few scattered intermittent myalgias and arthralgias.  Her husband is not affected.  She had a flu shot 10 days ago.  She and her husband shelter at home but they do go to the grocery store and went to [facility] for their flu shots.  She has no asthma history and never smoked.  History reviewed. No pertinent past medical history.  History reviewed. No pertinent surgical history.  History reviewed. No pertinent family history.  Social History   Socioeconomic History  . Marital status: Widowed    Spouse name: Not on file  . Number of children: Not on file  . Years of education: Not on file  . Highest education level: Not on file  Occupational History  . Not on file  Social Needs  . Financial resource strain: Not on file  . Food insecurity    Worry: Not on file    Inability: Not on file  . Transportation needs    Medical: Not on file    Non-medical: Not on file  Tobacco Use  . Smoking status: Never Smoker  . Smokeless tobacco: Never Used  Substance and Sexual Activity  . Alcohol use: Yes    Comment: drinks 2 scotch and waters nightly  . Drug use: Not on file  . Sexual activity: Not on file  Lifestyle  . Physical activity    Days per week: Not on file    Minutes per session: Not on file  . Stress: Not on file  Relationships  . Social Herbalist on phone: Not on file    Gets together: Not on file    Attends religious service: Not on file    Active member of club or organization: Not  on file    Attends meetings of clubs or organizations: Not on file    Relationship status: Not on file  . Intimate partner violence    Fear of current or ex partner: Not on file    Emotionally abused: Not on file    Physically abused: Not on file    Forced sexual activity: Not on file  Other Topics Concern  . Not on file  Social History Narrative  . Not on file    Outpatient Medications Prior to Visit  Medication Sig Dispense Refill  . aspirin 81 MG chewable tablet Chew 81 mg by mouth every other day.     . diphenhydrAMINE (BENADRYL) 25 mg capsule Take 1 capsule (25 mg total) by mouth every 6 (six) hours as needed for itching. 30 capsule 0  . metFORMIN (GLUCOPHAGE-XR) 500 MG 24 hr tablet TAKE 1 TABLET BY MOUTH AT  BEDTIME 90 tablet 1  . Multiple Vitamin (MULTIVITAMIN WITH MINERALS) TABS tablet Take 1 tablet by mouth daily.    . Omega-3 Fatty Acids (FISH OIL) 1000 MG CAPS Take 1,000 mg by mouth daily.     . simvastatin (ZOCOR) 20 MG tablet Take 1 tablet (20 mg total) by mouth daily at 6 PM. 90  tablet 2  . predniSONE (DELTASONE) 10 MG tablet Take 6 tablets (60 mg total) by mouth daily. (Patient not taking: Reported on 12/20/2017) 18 tablet 0   No facility-administered medications prior to visit.     No Known Allergies  ROS Review of Systems  Constitutional: Positive for fatigue. Negative for chills, diaphoresis, fever and unexpected weight change.  HENT: Negative for congestion, dental problem, postnasal drip, rhinorrhea and sore throat.   Eyes: Negative for photophobia and visual disturbance.  Respiratory: Negative for cough, choking and wheezing.   Cardiovascular: Negative for chest pain and palpitations.  Gastrointestinal: Negative for abdominal pain, diarrhea, nausea and vomiting.  Endocrine: Negative for polyphagia and polyuria.  Genitourinary: Negative for dysuria, frequency and urgency.  Musculoskeletal: Positive for arthralgias and myalgias.  Skin: Negative for pallor and  rash.  Allergic/Immunologic: Negative for immunocompromised state.  Neurological: Negative for light-headedness, numbness and headaches.  Hematological: Does not bruise/bleed easily.  Psychiatric/Behavioral: Negative.       Objective:    Physical Exam  Constitutional: She is oriented to person, place, and time. She appears well-developed and well-nourished. No distress.  HENT:  Head: Normocephalic and atraumatic.  Right Ear: External ear normal.  Left Ear: External ear normal.  Eyes: Conjunctivae are normal. Right eye exhibits no discharge. Left eye exhibits no discharge. No scleral icterus.  Neck: No JVD present. No tracheal deviation present.  Pulmonary/Chest: Effort normal. No stridor.  Neurological: She is alert and oriented to person, place, and time.  Skin: Skin is warm and dry. She is not diaphoretic.  Psychiatric: She has a normal mood and affect. Her behavior is normal.    There were no vitals taken for this visit. Wt Readings from Last 3 Encounters:  12/20/17 118 lb 3.2 oz (53.6 kg)  10/16/16 118 lb (53.5 kg)   BP Readings from Last 3 Encounters:  12/20/17 112/68  10/16/16 138/83   Guideline developer:  UpToDate (see UpToDate for funding source) Date Released: June 2014  Health Maintenance Due  Topic Date Due  . FOOT EXAM  02/04/1945  . OPHTHALMOLOGY EXAM  02/04/1945  . TETANUS/TDAP  02/04/1954  . DEXA SCAN  02/05/2000  . PNA vac Low Risk Adult (1 of 2 - PCV13) 02/05/2000  . HEMOGLOBIN A1C  06/20/2018  . URINE MICROALBUMIN  12/21/2018    There are no preventive care reminders to display for this patient.  No results found for: TSH Lab Results  Component Value Date   WBC 4.7 12/20/2017   HGB 13.3 12/20/2017   HCT 39.7 12/20/2017   MCV 95.2 12/20/2017   PLT 228.0 12/20/2017   Lab Results  Component Value Date   NA 139 12/20/2017   K 4.2 12/20/2017   CO2 28 12/20/2017   GLUCOSE 121 (H) 12/20/2017   BUN 14 12/20/2017   CREATININE 0.78 12/20/2017    BILITOT 0.6 12/20/2017   ALKPHOS 65 12/20/2017   AST 20 12/20/2017   ALT 21 12/20/2017   PROT 7.3 12/20/2017   ALBUMIN 4.5 12/20/2017   CALCIUM 9.8 12/20/2017   GFR 74.99 12/20/2017   Lab Results  Component Value Date   CHOL 213 (H) 12/20/2017   Lab Results  Component Value Date   HDL 67.20 12/20/2017   Lab Results  Component Value Date   LDLCALC 117 (H) 12/20/2017   Lab Results  Component Value Date   TRIG 144.0 12/20/2017   Lab Results  Component Value Date   CHOLHDL 3 12/20/2017   Lab Results  Component  Value Date   HGBA1C 7.4 (H) 12/20/2017      Assessment & Plan:   Problem List Items Addressed This Visit    None    Visit Diagnoses    Fever, unspecified fever cause    -  Primary   Relevant Orders   Novel Coronavirus, NAA ([facility])      No orders of the defined types were placed in this encounter.   Follow-up: Return in about 4 days (around 12/15/2018).    Patient advised to use over-the-counter cough and cold medicines with Tylenol.  Advised to be particularly vigilant for any shortness of breath or difficulty breathing.  Follow-up for recheck in 4 days.    Virtual Visit via Video Note  I connected with Yisel Schoolman Luebbe on 12/11/18 at  1:30 PM EDT by a video enabled telemedicine application and verified that I am speaking with the correct person using two identifiers.  Location: Patient: home Provider:    I discussed the limitations of evaluation and management by telemedicine and the availability of in person appointments. The patient expressed understanding and agreed to proceed.  History of Present Illness:    Observations/Objective:   Assessment and Plan:   Follow Up Instructions:    I discussed the assessment and treatment plan with the patient. The patient was provided an opportunity to ask questions and all were answered. The patient agreed with the plan and demonstrated an understanding of the instructions.   The patient was  advised to call back or seek an in-person evaluation if the symptoms worsen or if the condition fails to improve as anticipated.  I provided 20 minutes of non-face-to-face time during this encounter.   Mliss Sax, MD

## 2018-12-12 LAB — NOVEL CORONAVIRUS, NAA: SARS-CoV-2, NAA: NOT DETECTED

## 2018-12-15 ENCOUNTER — Telehealth: Payer: Self-pay

## 2018-12-15 ENCOUNTER — Encounter: Payer: Self-pay | Admitting: Family Medicine

## 2018-12-15 ENCOUNTER — Ambulatory Visit (INDEPENDENT_AMBULATORY_CARE_PROVIDER_SITE_OTHER): Payer: Medicare Other | Admitting: Family Medicine

## 2018-12-15 DIAGNOSIS — R509 Fever, unspecified: Secondary | ICD-10-CM | POA: Diagnosis not present

## 2018-12-15 NOTE — Telephone Encounter (Signed)

## 2018-12-15 NOTE — Progress Notes (Signed)
Established Patient Office Visit  Subjective:  Patient ID: Lori Valdez, female    DOB: [birth year]  Age: 85 y.o. MRN: [id number]  CC:  Chief Complaint  Patient presents with  . Follow-up    covid test negative, still having fever, sleepiness, and loss of appetite    HPI Al DecantJuliana C Valdez presents for follow-up of ongoing malaise and fatigue with decreased appetite.  Patient's temperature is down to 98 but she says this is actually a little bit high for her.  She continues to sleep a lot.  She is hydrating well with clear urine.  She has not had a bowel movement in a few days because she has not been eating.  She denies respiratory tract symptoms rashes back pain abdominal pain or dysuria.  She has no myalgias or arthralgias.  There is no rash on her hands and feet.  History reviewed. No pertinent past medical history.  History reviewed. No pertinent surgical history.  History reviewed. No pertinent family history.  Social History   Socioeconomic History  . Marital status: Widowed    Spouse name: Not on file  . Number of children: Not on file  . Years of education: Not on file  . Highest education level: Not on file  Occupational History  . Not on file  Social Needs  . Financial resource strain: Not on file  . Food insecurity    Worry: Not on file    Inability: Not on file  . Transportation needs    Medical: Not on file    Non-medical: Not on file  Tobacco Use  . Smoking status: Never Smoker  . Smokeless tobacco: Never Used  Substance and Sexual Activity  . Alcohol use: Yes    Comment: drinks 2 scotch and waters nightly  . Drug use: Not on file  . Sexual activity: Not on file  Lifestyle  . Physical activity    Days per week: Not on file    Minutes per session: Not on file  . Stress: Not on file  Relationships  . Social Musicianconnections    Talks on phone: Not on file    Gets together: Not on file    Attends religious service: Not on file    Active member of  club or organization: Not on file    Attends meetings of clubs or organizations: Not on file    Relationship status: Not on file  . Intimate partner violence    Fear of current or ex partner: Not on file    Emotionally abused: Not on file    Physically abused: Not on file    Forced sexual activity: Not on file  Other Topics Concern  . Not on file  Social History Narrative  . Not on file    Outpatient Medications Prior to Visit  Medication Sig Dispense Refill  . aspirin 81 MG chewable tablet Chew 81 mg by mouth every other day.     . diphenhydrAMINE (BENADRYL) 25 mg capsule Take 1 capsule (25 mg total) by mouth every 6 (six) hours as needed for itching. 30 capsule 0  . metFORMIN (GLUCOPHAGE-XR) 500 MG 24 hr tablet TAKE 1 TABLET BY MOUTH AT  BEDTIME 90 tablet 1  . Multiple Vitamin (MULTIVITAMIN WITH MINERALS) TABS tablet Take 1 tablet by mouth daily.    . Omega-3 Fatty Acids (FISH OIL) 1000 MG CAPS Take 1,000 mg by mouth daily.     . simvastatin (ZOCOR) 20 MG tablet Take 1 tablet (20  mg total) by mouth daily at 6 PM. 90 tablet 2   No facility-administered medications prior to visit.     No Known Allergies  ROS Review of Systems  Constitutional: Positive for fatigue. Negative for chills, diaphoresis, fever and unexpected weight change.  HENT: Negative for congestion, postnasal drip and rhinorrhea.   Eyes: Negative for photophobia and visual disturbance.  Respiratory: Negative for cough, shortness of breath and wheezing.   Cardiovascular: Negative.   Gastrointestinal: Positive for nausea. Negative for abdominal pain and vomiting.  Endocrine: Negative for polyphagia and polyuria.  Genitourinary: Negative for dysuria, frequency and urgency.  Musculoskeletal: Negative for arthralgias, back pain, joint swelling, neck pain and neck stiffness.  Skin: Negative for pallor and rash.  Neurological: Negative for weakness, numbness and headaches.  Hematological: Negative.    Psychiatric/Behavioral: Negative.       Objective:    Physical Exam  Constitutional: She is oriented to person, place, and time. She appears well-developed and well-nourished. No distress.  HENT:  Head: Normocephalic and atraumatic.  Right Ear: External ear normal.  Left Ear: External ear normal.  Eyes: Conjunctivae are normal. Right eye exhibits no discharge. Left eye exhibits no discharge. No scleral icterus.  Neck: No JVD present. No tracheal deviation present.  Pulmonary/Chest: Effort normal. No stridor.  Neurological: She is alert and oriented to person, place, and time.  Skin: Skin is warm and dry. She is not diaphoretic.     Psychiatric: She has a normal mood and affect. Her behavior is normal.    There were no vitals taken for this visit. Wt Readings from Last 3 Encounters:  12/20/17 118 lb 3.2 oz (53.6 kg)  10/16/16 118 lb (53.5 kg)   BP Readings from Last 3 Encounters:  12/20/17 112/68  10/16/16 138/83   Guideline developer:  UpToDate (see UpToDate for funding source) Date Released: June 2014  Health Maintenance Due  Topic Date Due  . FOOT EXAM  02/04/1945  . OPHTHALMOLOGY EXAM  02/04/1945  . TETANUS/TDAP  02/04/1954  . DEXA SCAN  02/05/2000  . PNA vac Low Risk Adult (1 of 2 - PCV13) 02/05/2000  . HEMOGLOBIN A1C  06/20/2018  . URINE MICROALBUMIN  12/21/2018    There are no preventive care reminders to display for this patient.  No results found for: TSH Lab Results  Component Value Date   WBC 4.7 12/20/2017   HGB 13.3 12/20/2017   HCT 39.7 12/20/2017   MCV 95.2 12/20/2017   PLT 228.0 12/20/2017   Lab Results  Component Value Date   NA 139 12/20/2017   K 4.2 12/20/2017   CO2 28 12/20/2017   GLUCOSE 121 (H) 12/20/2017   BUN 14 12/20/2017   CREATININE 0.78 12/20/2017   BILITOT 0.6 12/20/2017   ALKPHOS 65 12/20/2017   AST 20 12/20/2017   ALT 21 12/20/2017   PROT 7.3 12/20/2017   ALBUMIN 4.5 12/20/2017   CALCIUM 9.8 12/20/2017   GFR 74.99  12/20/2017   Lab Results  Component Value Date   CHOL 213 (H) 12/20/2017   Lab Results  Component Value Date   HDL 67.20 12/20/2017   Lab Results  Component Value Date   LDLCALC 117 (H) 12/20/2017   Lab Results  Component Value Date   TRIG 144.0 12/20/2017   Lab Results  Component Value Date   CHOLHDL 3 12/20/2017   Lab Results  Component Value Date   HGBA1C 7.4 (H) 12/20/2017      Assessment & Plan:   Problem  List Items Addressed This Visit    None    Visit Diagnoses    Fever, unspecified fever cause    -  Primary      No orders of the defined types were placed in this encounter.   Follow-up: Return in about 4 days (around 12/19/2018).    Needs fu of her elevated HA1C.   Virtual Visit via Video Note  I connected with Rocky Rishel Kaney on 12/15/18 at  1:00 PM EDT by a video enabled telemedicine application and verified that I am speaking with the correct person using two identifiers.  Location: Patient: home Provider:    I discussed the limitations of evaluation and management by telemedicine and the availability of in person appointments. The patient expressed understanding and agreed to proceed.  History of Present Illness:    Observations/Objective:   Assessment and Plan:   Follow Up Instructions:    I discussed the assessment and treatment plan with the patient. The patient was provided an opportunity to ask questions and all were answered. The patient agreed with the plan and demonstrated an understanding of the instructions.   The patient was advised to call back or seek an in-person evaluation if the symptoms worsen or if the condition fails to improve as anticipated.  I provided 15 minutes of non-face-to-face time during this encounter.   Libby Maw, MD

## 2018-12-19 ENCOUNTER — Other Ambulatory Visit: Payer: Self-pay

## 2018-12-19 ENCOUNTER — Ambulatory Visit (INDEPENDENT_AMBULATORY_CARE_PROVIDER_SITE_OTHER): Payer: Medicare Other | Admitting: Family Medicine

## 2018-12-19 ENCOUNTER — Encounter: Payer: Self-pay | Admitting: Family Medicine

## 2018-12-19 VITALS — BP 110/60 | HR 87 | Temp 98.4°F | Ht 63.0 in | Wt 111.0 lb

## 2018-12-19 DIAGNOSIS — Z87898 Personal history of other specified conditions: Secondary | ICD-10-CM | POA: Insufficient documentation

## 2018-12-19 DIAGNOSIS — E7800 Pure hypercholesterolemia, unspecified: Secondary | ICD-10-CM

## 2018-12-19 DIAGNOSIS — E119 Type 2 diabetes mellitus without complications: Secondary | ICD-10-CM | POA: Diagnosis not present

## 2018-12-19 NOTE — Progress Notes (Signed)
Established Patient Office Visit  Subjective:  Patient ID: Lori Valdez, female    DOB: Nov 19, 1932  Age: 85 y.o. MRN: [id number]  CC:  Chief Complaint  Patient presents with  . Follow-up    HPI Lori Valdez presents for follow-up of her gastroenteritis, elevated cholesterol and diabetes.  She is feeling much better.  Appetite is returning she is no longer febrile.  Denies abdominal pain nausea or vomiting.  She has been taking her metformin without issue.  Continue Zocor denies myalgias with it.  She continues to be active and actually mowed the lawn today.  She was unable to see the dentist or the ophthalmologist this year secondary to COVID.  They have both advised her that they will call her with her next appointments.  History reviewed. No pertinent past medical history.  History reviewed. No pertinent surgical history.  History reviewed. No pertinent family history.  Social History   Socioeconomic History  . Marital status: Widowed    Spouse name: Not on file  . Number of children: Not on file  . Years of education: Not on file  . Highest education level: Not on file  Occupational History  . Not on file  Social Needs  . Financial resource strain: Not on file  . Food insecurity    Worry: Not on file    Inability: Not on file  . Transportation needs    Medical: Not on file    Non-medical: Not on file  Tobacco Use  . Smoking status: Never Smoker  . Smokeless tobacco: Never Used  Substance and Sexual Activity  . Alcohol use: Yes    Comment: drinks 2 scotch and waters nightly  . Drug use: Not on file  . Sexual activity: Not on file  Lifestyle  . Physical activity    Days per week: Not on file    Minutes per session: Not on file  . Stress: Not on file  Relationships  . Social Musicianconnections    Talks on phone: Not on file    Gets together: Not on file    Attends religious service: Not on file    Active member of club or organization: Not on file   Attends meetings of clubs or organizations: Not on file    Relationship status: Not on file  . Intimate partner violence    Fear of current or ex partner: Not on file    Emotionally abused: Not on file    Physically abused: Not on file    Forced sexual activity: Not on file  Other Topics Concern  . Not on file  Social History Narrative  . Not on file    Outpatient Medications Prior to Visit  Medication Sig Dispense Refill  . aspirin 81 MG chewable tablet Chew 81 mg by mouth every other day.     . diphenhydrAMINE (BENADRYL) 25 mg capsule Take 1 capsule (25 mg total) by mouth every 6 (six) hours as needed for itching. 30 capsule 0  . metFORMIN (GLUCOPHAGE-XR) 500 MG 24 hr tablet TAKE 1 TABLET BY MOUTH AT  BEDTIME 90 tablet 1  . Multiple Vitamin (MULTIVITAMIN WITH MINERALS) TABS tablet Take 1 tablet by mouth daily.    . Omega-3 Fatty Acids (FISH OIL) 1000 MG CAPS Take 1,000 mg by mouth daily.     . simvastatin (ZOCOR) 20 MG tablet Take 1 tablet (20 mg total) by mouth daily at 6 PM. 90 tablet 2   No facility-administered medications prior to  visit.     No Known Allergies  ROS Review of Systems  Constitutional: Negative.   HENT: Negative.   Eyes: Negative for photophobia and visual disturbance.  Respiratory: Negative.   Cardiovascular: Negative.   Gastrointestinal: Negative.  Negative for abdominal pain, constipation, diarrhea, nausea and vomiting.  Endocrine: Negative for polyphagia and polyuria.  Genitourinary: Negative for frequency, hematuria and urgency.  Musculoskeletal: Negative for arthralgias, gait problem, joint swelling and myalgias.  Skin: Negative for pallor and rash.  Allergic/Immunologic: Negative for immunocompromised state.  Neurological: Negative for light-headedness and headaches.  Hematological: Does not bruise/bleed easily.  Psychiatric/Behavioral: Negative.       Objective:    Physical Exam  Constitutional: She is oriented to person, place, and time.  She appears well-developed and well-nourished. No distress.  HENT:  Head: Normocephalic and atraumatic.  Right Ear: External ear normal.  Left Ear: External ear normal.  Mouth/Throat: Oropharynx is clear and moist. No oropharyngeal exudate.  Eyes: Pupils are equal, round, and reactive to light. Conjunctivae are normal. Right eye exhibits no discharge. Left eye exhibits no discharge. No scleral icterus.  Neck: Neck supple. No JVD present. No tracheal deviation present. No thyromegaly present.  Cardiovascular: Normal rate, regular rhythm and normal heart sounds.  Pulmonary/Chest: Effort normal and breath sounds normal. No stridor.  Abdominal: Bowel sounds are normal. She exhibits no distension and no mass. There is no abdominal tenderness. There is no rebound and no guarding.  Musculoskeletal:        General: No edema.  Lymphadenopathy:    She has no cervical adenopathy.  Neurological: She is alert and oriented to person, place, and time.  Skin: Skin is warm and dry.  Psychiatric: She has a normal mood and affect. Her behavior is normal.    BP 110/60   Pulse 87   Temp 98.4 F (36.9 C) (Oral)   Ht 5\' 3"  (1.6 m)   Wt 111 lb (50.3 kg)   SpO2 96%   BMI 19.66 kg/m  Wt Readings from Last 3 Encounters:  12/19/18 111 lb (50.3 kg)  12/20/17 118 lb 3.2 oz (53.6 kg)  10/16/16 118 lb (53.5 kg)   BP Readings from Last 3 Encounters:  12/19/18 110/60  12/20/17 112/68  10/16/16 138/83   Guideline developer:  UpToDate (see UpToDate for funding source) Date Released: June 2014  Health Maintenance Due  Topic Date Due  . FOOT EXAM  02/04/1945  . OPHTHALMOLOGY EXAM  02/04/1945  . TETANUS/TDAP  02/04/1954  . DEXA SCAN  02/05/2000  . PNA vac Low Risk Adult (1 of 2 - PCV13) 02/05/2000  . HEMOGLOBIN A1C  06/20/2018  . URINE MICROALBUMIN  12/21/2018    There are no preventive care reminders to display for this patient.  No results found for: TSH Lab Results  Component Value Date   WBC  4.7 12/20/2017   HGB 13.3 12/20/2017   HCT 39.7 12/20/2017   MCV 95.2 12/20/2017   PLT 228.0 12/20/2017   Lab Results  Component Value Date   NA 139 12/20/2017   K 4.2 12/20/2017   CO2 28 12/20/2017   GLUCOSE 121 (H) 12/20/2017   BUN 14 12/20/2017   CREATININE 0.78 12/20/2017   BILITOT 0.6 12/20/2017   ALKPHOS 65 12/20/2017   AST 20 12/20/2017   ALT 21 12/20/2017   PROT 7.3 12/20/2017   ALBUMIN 4.5 12/20/2017   CALCIUM 9.8 12/20/2017   GFR 74.99 12/20/2017   Lab Results  Component Value Date   CHOL  213 (H) 12/20/2017   Lab Results  Component Value Date   HDL 67.20 12/20/2017   Lab Results  Component Value Date   LDLCALC 117 (H) 12/20/2017   Lab Results  Component Value Date   TRIG 144.0 12/20/2017   Lab Results  Component Value Date   CHOLHDL 3 12/20/2017   Lab Results  Component Value Date   HGBA1C 7.4 (H) 12/20/2017      Assessment & Plan:   Problem List Items Addressed This Visit      Endocrine   Diabetes mellitus without complication (HCC)   Relevant Orders   CBC   Comprehensive metabolic panel   Hemoglobin A1c   Microalbumin / creatinine urine ratio   Urinalysis, Routine w reflex microscopic     Other   Elevated LDL cholesterol level - Primary   Relevant Orders   LDL cholesterol, direct   History of fever      No orders of the defined types were placed in this encounter.   Follow-up: Return in about 6 months (around 06/18/2019), or if symptoms worsen or fail to improve.

## 2018-12-20 LAB — URINALYSIS, ROUTINE W REFLEX MICROSCOPIC
Bilirubin Urine: NEGATIVE
Ketones, ur: NEGATIVE
Nitrite: NEGATIVE
Specific Gravity, Urine: 1.01 (ref 1.000–1.030)
Total Protein, Urine: NEGATIVE
Urine Glucose: NEGATIVE
Urobilinogen, UA: 0.2 (ref 0.0–1.0)
pH: 5.5 (ref 5.0–8.0)

## 2018-12-20 LAB — CBC
HCT: 33.3 % — ABNORMAL LOW (ref 36.0–46.0)
Hemoglobin: 10.9 g/dL — ABNORMAL LOW (ref 12.0–15.0)
MCHC: 32.7 g/dL (ref 30.0–36.0)
MCV: 95.7 fl (ref 78.0–100.0)
Platelets: 412 10*3/uL — ABNORMAL HIGH (ref 150.0–400.0)
RBC: 3.47 Mil/uL — ABNORMAL LOW (ref 3.87–5.11)
RDW: 12.9 % (ref 11.5–15.5)
WBC: 8.8 10*3/uL (ref 4.0–10.5)

## 2018-12-20 LAB — MICROALBUMIN / CREATININE URINE RATIO
Creatinine,U: 68.3 mg/dL
Microalb Creat Ratio: 7.4 mg/g (ref 0.0–30.0)
Microalb, Ur: 5.1 mg/dL — ABNORMAL HIGH (ref 0.0–1.9)

## 2018-12-20 LAB — COMPREHENSIVE METABOLIC PANEL
ALT: 31 U/L (ref 0–35)
AST: 23 U/L (ref 0–37)
Albumin: 3.5 g/dL (ref 3.5–5.2)
Alkaline Phosphatase: 239 U/L — ABNORMAL HIGH (ref 39–117)
BUN: 16 mg/dL (ref 6–23)
CO2: 29 mEq/L (ref 19–32)
Calcium: 9.7 mg/dL (ref 8.4–10.5)
Chloride: 103 mEq/L (ref 96–112)
Creatinine, Ser: 0.85 mg/dL (ref 0.40–1.20)
GFR: 63.74 mL/min (ref >60.00)
Glucose, Bld: 140 mg/dL — ABNORMAL HIGH (ref 70–99)
Potassium: 3.8 mEq/L (ref 3.5–5.1)
Sodium: 140 mEq/L (ref 135–145)
Total Bilirubin: 0.5 mg/dL (ref 0.2–1.2)
Total Protein: 6.9 g/dL (ref 6.0–8.3)

## 2018-12-20 LAB — HEMOGLOBIN A1C: Hgb A1c MFr Bld: 8.2 % — ABNORMAL HIGH (ref 4.6–6.5)

## 2018-12-20 LAB — LDL CHOLESTEROL, DIRECT: Direct LDL: 88 mg/dL

## 2018-12-27 ENCOUNTER — Telehealth: Payer: Self-pay

## 2018-12-27 NOTE — Telephone Encounter (Signed)

## 2018-12-28 ENCOUNTER — Other Ambulatory Visit: Payer: Self-pay

## 2018-12-28 ENCOUNTER — Other Ambulatory Visit (INDEPENDENT_AMBULATORY_CARE_PROVIDER_SITE_OTHER): Payer: Medicare Other

## 2018-12-28 ENCOUNTER — Encounter: Payer: Self-pay | Admitting: Family Medicine

## 2018-12-28 ENCOUNTER — Ambulatory Visit (INDEPENDENT_AMBULATORY_CARE_PROVIDER_SITE_OTHER): Payer: Medicare Other | Admitting: Family Medicine

## 2018-12-28 VITALS — BP 118/68 | HR 72 | Ht 63.0 in | Wt 110.0 lb

## 2018-12-28 DIAGNOSIS — D649 Anemia, unspecified: Secondary | ICD-10-CM

## 2018-12-28 DIAGNOSIS — E2839 Other primary ovarian failure: Secondary | ICD-10-CM

## 2018-12-28 DIAGNOSIS — E538 Deficiency of other specified B group vitamins: Secondary | ICD-10-CM

## 2018-12-28 DIAGNOSIS — R748 Abnormal levels of other serum enzymes: Secondary | ICD-10-CM

## 2018-12-28 DIAGNOSIS — E119 Type 2 diabetes mellitus without complications: Secondary | ICD-10-CM

## 2018-12-28 DIAGNOSIS — E7800 Pure hypercholesterolemia, unspecified: Secondary | ICD-10-CM

## 2018-12-28 DIAGNOSIS — E0590 Thyrotoxicosis, unspecified without thyrotoxic crisis or storm: Secondary | ICD-10-CM

## 2018-12-28 LAB — T3, FREE: T3, Free: 2.9 pg/mL (ref 2.3–4.2)

## 2018-12-28 LAB — VITAMIN B12: Vitamin B-12: 906 pg/mL (ref 211–911)

## 2018-12-28 LAB — TSH: TSH: 0.31 u[IU]/mL — ABNORMAL LOW (ref 0.35–4.50)

## 2018-12-28 MED ORDER — METFORMIN HCL ER 500 MG PO TB24: ORAL_TABLET | ORAL | 1 refills | Status: DC

## 2018-12-28 NOTE — Progress Notes (Signed)
Established Patient Office Visit  Subjective:  Patient ID: Lori Valdez, female    DOB: 21-Feb-1933  Age: 85 y.o. MRN: [id number]  CC:  Chief Complaint  Patient presents with  . Follow-up    HPI Lori Valdez presents for follow-up of multiple issues and concerns.  Hemoglobin has risen.  Patient is tolerated extended release Glucophage and agrees to take it twice daily.  Her alkaline phosphatase was elevated with normal liver enzymes.  She has 1 alcoholic drink nightly.  It is a light scotch and water.  She has no bony pain other than occasional random aches and pains.  Cannot remember her last DEXA scan.  She has no smoking history to speak of.  Hemoglobin has dropped by 2 points since last check 1 year ago.  Patient denies seeing any blood in her stool or urine.  There is no vaginal bleeding.  She denies dark tarry stools.  Believes that she had a colonoscopy 3 years ago that was normal.  History reviewed. No pertinent past medical history.  History reviewed. No pertinent surgical history.  History reviewed. No pertinent family history.  Social History   Socioeconomic History  . Marital status: Widowed    Spouse name: Not on file  . Number of children: Not on file  . Years of education: Not on file  . Highest education level: Not on file  Occupational History  . Not on file  Social Needs  . Financial resource strain: Not on file  . Food insecurity    Worry: Not on file    Inability: Not on file  . Transportation needs    Medical: Not on file    Non-medical: Not on file  Tobacco Use  . Smoking status: Never Smoker  . Smokeless tobacco: Never Used  Substance and Sexual Activity  . Alcohol use: Yes    Comment: drinks 2 scotch and waters nightly  . Drug use: Not on file  . Sexual activity: Not on file  Lifestyle  . Physical activity    Days per week: Not on file    Minutes per session: Not on file  . Stress: Not on file  Relationships  . Social Product manager on phone: Not on file    Gets together: Not on file    Attends religious service: Not on file    Active member of club or organization: Not on file    Attends meetings of clubs or organizations: Not on file    Relationship status: Not on file  . Intimate partner violence    Fear of current or ex partner: Not on file    Emotionally abused: Not on file    Physically abused: Not on file    Forced sexual activity: Not on file  Other Topics Concern  . Not on file  Social History Narrative  . Not on file    Outpatient Medications Prior to Visit  Medication Sig Dispense Refill  . aspirin 81 MG chewable tablet Chew 81 mg by mouth every other day.     . diphenhydrAMINE (BENADRYL) 25 mg capsule Take 1 capsule (25 mg total) by mouth every 6 (six) hours as needed for itching. 30 capsule 0  . Multiple Vitamin (MULTIVITAMIN WITH MINERALS) TABS tablet Take 1 tablet by mouth daily.    . Omega-3 Fatty Acids (FISH OIL) 1000 MG CAPS Take 1,000 mg by mouth daily.     . simvastatin (ZOCOR) 20 MG tablet Take 1  tablet (20 mg total) by mouth daily at 6 PM. 90 tablet 2  . metFORMIN (GLUCOPHAGE-XR) 500 MG 24 hr tablet TAKE 1 TABLET BY MOUTH AT  BEDTIME 90 tablet 1   No facility-administered medications prior to visit.     No Known Allergies  ROS Review of Systems  Constitutional: Negative for diaphoresis, fatigue, fever and unexpected weight change.  HENT: Negative.   Eyes: Negative for photophobia and visual disturbance.  Respiratory: Negative.   Cardiovascular: Negative.   Gastrointestinal: Negative for abdominal pain, anal bleeding, blood in stool, constipation, diarrhea, nausea and vomiting.  Endocrine: Negative for polyphagia and polyuria.  Genitourinary: Negative for frequency, hematuria, urgency and vaginal bleeding.  Musculoskeletal: Negative for arthralgias, back pain, neck pain and neck stiffness.  Skin: Negative for pallor and rash.  Allergic/Immunologic: Negative for  immunocompromised state.  Neurological: Negative for weakness and light-headedness.  Hematological: Does not bruise/bleed easily.  Psychiatric/Behavioral: Negative.       Objective:    Physical Exam  Constitutional: She is oriented to person, place, and time. She appears well-developed and well-nourished. No distress.  HENT:  Head: Normocephalic and atraumatic.  Right Ear: External ear normal.  Left Ear: External ear normal.  Mouth/Throat: Oropharynx is clear and moist. No oropharyngeal exudate.  Eyes: Conjunctivae are normal. Right eye exhibits no discharge. Left eye exhibits no discharge. No scleral icterus.  Neck: Neck supple. No JVD present. No tracheal deviation present. No thyromegaly present.  Cardiovascular: Normal rate, regular rhythm and normal heart sounds.  Pulmonary/Chest: Effort normal and breath sounds normal. No stridor.  Abdominal: Bowel sounds are normal. She exhibits no distension. There is no abdominal tenderness. There is no rebound and no guarding.  Musculoskeletal:        General: No tenderness, deformity or edema.     Right shoulder: She exhibits normal range of motion and no tenderness.     Left shoulder: She exhibits normal range of motion and no tenderness.     Right elbow: She exhibits normal range of motion. No tenderness found.     Left elbow: She exhibits normal range of motion. No tenderness found.     Right wrist: She exhibits no bony tenderness.     Left wrist: She exhibits no tenderness.     Right hip: She exhibits decreased range of motion. She exhibits no tenderness and no bony tenderness.     Left hip: She exhibits decreased range of motion. She exhibits no tenderness and no bony tenderness.     Right ankle: No tenderness.     Left ankle: No tenderness.     Cervical back: Normal.     Thoracic back: Normal.     Lumbar back: Normal.     Right forearm: She exhibits no tenderness.     Left forearm: She exhibits no tenderness.     Right upper  leg: She exhibits no tenderness.     Left upper leg: She exhibits no tenderness.     Right lower leg: She exhibits no tenderness.     Left lower leg: She exhibits no tenderness.     Right foot: No tenderness.     Left foot: No tenderness.     Comments: Entire skeletal survey was negative.   Lymphadenopathy:    She has no cervical adenopathy.  Neurological: She is alert and oriented to person, place, and time.  Skin: Skin is warm and dry. She is not diaphoretic.  Psychiatric: She has a normal mood and affect. Her behavior  is normal.    BP 118/68   Pulse 72   Ht 5\' 3"  (1.6 m)   Wt 110 lb (49.9 kg)   SpO2 97%   BMI 19.49 kg/m  Wt Readings from Last 3 Encounters:  12/28/18 110 lb (49.9 kg)  12/19/18 111 lb (50.3 kg)  12/20/17 118 lb 3.2 oz (53.6 kg)   BP Readings from Last 3 Encounters:  12/28/18 118/68  12/19/18 110/60  12/20/17 112/68   Guideline developer:  UpToDate (see UpToDate for funding source) Date Released: June 2014  Health Maintenance Due  Topic Date Due  . FOOT EXAM  02/04/1945  . OPHTHALMOLOGY EXAM  02/04/1945  . TETANUS/TDAP  02/04/1954  . DEXA SCAN  02/05/2000  . PNA vac Low Risk Adult (1 of 2 - PCV13) 02/05/2000    There are no preventive care reminders to display for this patient.  No results found for: TSH Lab Results  Component Value Date   WBC 8.8 12/19/2018   HGB 10.9 Repeated and verified X2. (L) 12/19/2018   HCT 33.3 (L) 12/19/2018   MCV 95.7 12/19/2018   PLT 412.0 (H) 12/19/2018   Lab Results  Component Value Date   NA 140 12/19/2018   K 3.8 12/19/2018   CO2 29 12/19/2018   GLUCOSE 140 (H) 12/19/2018   BUN 16 12/19/2018   CREATININE 0.85 12/19/2018   BILITOT 0.5 12/19/2018   ALKPHOS 239 (H) 12/19/2018   AST 23 12/19/2018   ALT 31 12/19/2018   PROT 6.9 12/19/2018   ALBUMIN 3.5 12/19/2018   CALCIUM 9.7 12/19/2018   GFR 63.74 12/19/2018   Lab Results  Component Value Date   CHOL 213 (H) 12/20/2017   Lab Results  Component  Value Date   HDL 67.20 12/20/2017   Lab Results  Component Value Date   LDLCALC 117 (H) 12/20/2017   Lab Results  Component Value Date   TRIG 144.0 12/20/2017   Lab Results  Component Value Date   CHOLHDL 3 12/20/2017   Lab Results  Component Value Date   HGBA1C 8.2 (H) 12/19/2018      Assessment & Plan:   Problem List Items Addressed This Visit      Endocrine   Diabetes mellitus without complication (HCC) - Primary   Relevant Medications   metFORMIN (GLUCOPHAGE XR) 500 MG 24 hr tablet     Other   Elevated LDL cholesterol level   Elevated alkaline phosphatase level   Anemia   Relevant Orders   Iron, TIBC and Ferritin Panel   POC Hemoccult Bld/Stl (3-Cd Home Screen)   TSH   Estrogen deficiency   Relevant Orders   DG Bone Density   B12 deficiency   Relevant Orders   Vitamin B12      Meds ordered this encounter  Medications  . metFORMIN (GLUCOPHAGE XR) 500 MG 24 hr tablet    Sig: Take one tablet twice daily    Dispense:  180 tablet    Refill:  1    Follow-up: Return in about 1 month (around 01/27/2019).

## 2018-12-28 NOTE — Patient Instructions (Signed)
Anemia  Anemia is a condition in which you do not have enough red blood cells or hemoglobin. Hemoglobin is a substance in red blood cells that carries oxygen. When you do not have enough red blood cells or hemoglobin (are anemic), your body cannot get enough oxygen and your organs may not work properly. As a result, you may feel very tired or have other problems. What are the causes? Common causes of anemia include:  Excessive bleeding. Anemia can be caused by excessive bleeding inside or outside the body, including bleeding from the intestine or from periods in women.  Poor nutrition.  Long-lasting (chronic) kidney, thyroid, and liver disease.  Bone marrow disorders.  Cancer and treatments for cancer.  HIV (human immunodeficiency virus) and AIDS (acquired immunodeficiency syndrome).  Treatments for HIV and AIDS.  Spleen problems.  Blood disorders.  Infections, medicines, and autoimmune disorders that destroy red blood cells. What are the signs or symptoms? Symptoms of this condition include:  Minor weakness.  Dizziness.  Headache.  Feeling heartbeats that are irregular or faster than normal (palpitations).  Shortness of breath, especially with exercise.  Paleness.  Cold sensitivity.  Indigestion.  Nausea.  Difficulty sleeping.  Difficulty concentrating. Symptoms may occur suddenly or develop slowly. If your anemia is mild, you may not have symptoms. How is this diagnosed? This condition is diagnosed based on:  Blood tests.  Your medical history.  A physical exam.  Bone marrow biopsy. Your health care provider may also check your stool (feces) for blood and may do additional testing to look for the cause of your bleeding. You may also have other tests, including:  Imaging tests, such as a CT scan or MRI.  Endoscopy.  Colonoscopy. How is this treated? Treatment for this condition depends on the cause. If you continue to lose a lot of blood, you may  need to be treated at a hospital. Treatment may include:  Taking supplements of iron, vitamin S31, or folic acid.  Taking a hormone medicine (erythropoietin) that can help to stimulate red blood cell growth.  Having a blood transfusion. This may be needed if you lose a lot of blood.  Making changes to your diet.  Having surgery to remove your spleen. Follow these instructions at home:  Take over-the-counter and prescription medicines only as told by your health care provider.  Take supplements only as told by your health care provider.  Follow any diet instructions that you were given.  Keep all follow-up visits as told by your health care provider. This is important. Contact a health care provider if:  You develop new bleeding anywhere in the body. Get help right away if:  You are very weak.  You are short of breath.  You have pain in your abdomen or chest.  You are dizzy or feel faint.  You have trouble concentrating.  You have bloody or black, tarry stools.  You vomit repeatedly or you vomit up blood. Summary  Anemia is a condition in which you do not have enough red blood cells or enough of a substance in your red blood cells that carries oxygen (hemoglobin).  Symptoms may occur suddenly or develop slowly.  If your anemia is mild, you may not have symptoms.  This condition is diagnosed with blood tests as well as a medical history and physical exam. Other tests may be needed.  Treatment for this condition depends on the cause of the anemia. This information is not intended to replace advice given to you by  your health care provider. Make sure you discuss any questions you have with your health care provider. Document Released: 05/06/2004 Document Revised: 03/11/2017 Document Reviewed: 04/30/2016 Elsevier Patient Education  2020 [employer].

## 2018-12-28 NOTE — Addendum Note (Signed)
Addended by: Jon Billings on: 12/28/2018 03:34 PM   Modules accepted: Orders

## 2018-12-29 LAB — IRON,TIBC AND FERRITIN PANEL
%SAT: 36 % (calc) (ref 16–45)
Ferritin: 198 ng/mL (ref 16–288)
Iron: 92 ug/dL (ref 45–160)
TIBC: 254 mcg/dL (calc) (ref 250–450)

## 2019-01-02 ENCOUNTER — Other Ambulatory Visit: Payer: Self-pay

## 2019-01-02 ENCOUNTER — Ambulatory Visit (HOSPITAL_BASED_OUTPATIENT_CLINIC_OR_DEPARTMENT_OTHER)
Admission: RE | Admit: 2019-01-02 | Discharge: 2019-01-02 | Disposition: A | Discharge status: Home / Self Care | Payer: Medicare Other | Source: Ambulatory Visit | Attending: Family Medicine | Admitting: Family Medicine

## 2019-01-02 DIAGNOSIS — E2839 Other primary ovarian failure: Secondary | ICD-10-CM | POA: Diagnosis present

## 2019-01-11 ENCOUNTER — Encounter: Payer: Self-pay | Admitting: Family Medicine

## 2019-01-15 ENCOUNTER — Other Ambulatory Visit: Payer: Self-pay | Admitting: Family Medicine

## 2019-01-15 DIAGNOSIS — Z1231 Encounter for screening mammogram for malignant neoplasm of breast: Secondary | ICD-10-CM

## 2019-01-22 ENCOUNTER — Other Ambulatory Visit: Payer: Self-pay | Admitting: Family Medicine

## 2019-01-29 ENCOUNTER — Encounter: Payer: Self-pay | Admitting: Family Medicine

## 2019-01-29 ENCOUNTER — Ambulatory Visit (INDEPENDENT_AMBULATORY_CARE_PROVIDER_SITE_OTHER): Payer: Medicare Other | Admitting: Family Medicine

## 2019-01-29 ENCOUNTER — Other Ambulatory Visit: Payer: Self-pay

## 2019-01-29 VITALS — BP 120/60 | HR 76 | Ht 63.0 in | Wt 112.0 lb

## 2019-01-29 DIAGNOSIS — E119 Type 2 diabetes mellitus without complications: Secondary | ICD-10-CM

## 2019-01-29 DIAGNOSIS — R748 Abnormal levels of other serum enzymes: Secondary | ICD-10-CM

## 2019-01-29 DIAGNOSIS — E0590 Thyrotoxicosis, unspecified without thyrotoxic crisis or storm: Secondary | ICD-10-CM | POA: Diagnosis not present

## 2019-01-29 DIAGNOSIS — D649 Anemia, unspecified: Secondary | ICD-10-CM

## 2019-01-29 LAB — TSH: TSH: 1.21 u[IU]/mL (ref 0.35–4.50)

## 2019-01-29 MED ORDER — METFORMIN HCL 500 MG PO TABS: 500.0000 mg | ORAL_TABLET | Freq: Two times a day (BID) | ORAL | 3 refills | Status: DC

## 2019-01-29 NOTE — Progress Notes (Signed)
Pt must come back in for Alkaline Phosphatase, Isoenzyme lab/thx dmf

## 2019-01-29 NOTE — Progress Notes (Signed)
Established Patient Office Visit  Subjective:  Patient ID: Lori Valdez, female    DOB: 02-23-1933  Age: 85 y.o. MRN: [id number]  CC:  Chief Complaint  Patient presents with  . Follow-up    HPI Lori Valdez presents for follow-up of her diabetes.  She is tolerating the Glucophage well.  She feels great.  Denies to increase frequency of urination or fatigue.  She is having no changes in GI side effects.  She obtained the Hemoccult for this but unfortunately our lab failed to run them.  She is seeing no black tarry stools or blood in her stool or urine.  She continues to exercise vigorously.  She has no bony pain. History reviewed. No pertinent past medical history.  History reviewed. No pertinent surgical history.  History reviewed. No pertinent family history.  Social History   Socioeconomic History  . Marital status: Married    Spouse name: Not on file  . Number of children: Not on file  . Years of education: Not on file  . Highest education level: Not on file  Occupational History  . Not on file  Social Needs  . Financial resource strain: Not on file  . Food insecurity    Worry: Not on file    Inability: Not on file  . Transportation needs    Medical: Not on file    Non-medical: Not on file  Tobacco Use  . Smoking status: Never Smoker  . Smokeless tobacco: Never Used  Substance and Sexual Activity  . Alcohol use: Yes    Comment: drinks 2 scotch and waters nightly  . Drug use: Not on file  . Sexual activity: Not on file  Lifestyle  . Physical activity    Days per week: Not on file    Minutes per session: Not on file  . Stress: Not on file  Relationships  . Social Musicianconnections    Talks on phone: Not on file    Gets together: Not on file    Attends religious service: Not on file    Active member of club or organization: Not on file    Attends meetings of clubs or organizations: Not on file    Relationship status: Not on file  . Intimate partner  violence    Fear of current or ex partner: Not on file    Emotionally abused: Not on file    Physically abused: Not on file    Forced sexual activity: Not on file  Other Topics Concern  . Not on file  Social History Narrative  . Not on file    Outpatient Medications Prior to Visit  Medication Sig Dispense Refill  . aspirin 81 MG chewable tablet Chew 81 mg by mouth every other day.     . diphenhydrAMINE (BENADRYL) 25 mg capsule Take 1 capsule (25 mg total) by mouth every 6 (six) hours as needed for itching. 30 capsule 0  . Multiple Vitamin (MULTIVITAMIN WITH MINERALS) TABS tablet Take 1 tablet by mouth daily.    . Omega-3 Fatty Acids (FISH OIL) 1000 MG CAPS Take 1,000 mg by mouth daily.     . simvastatin (ZOCOR) 20 MG tablet TAKE 1 TABLET BY MOUTH  DAILY AT 6 PM. 90 tablet 3  . metFORMIN (GLUCOPHAGE XR) 500 MG 24 hr tablet Take one tablet twice daily 180 tablet 1   No facility-administered medications prior to visit.     No Known Allergies  ROS Review of Systems  Constitutional: Negative  for chills, diaphoresis, fatigue, fever and unexpected weight change.  HENT: Negative.   Eyes: Negative for photophobia and visual disturbance.  Respiratory: Negative.   Cardiovascular: Negative.   Gastrointestinal: Negative.  Negative for abdominal pain, anal bleeding and blood in stool.  Endocrine: Negative for polyphagia and polyuria.  Genitourinary: Negative for hematuria.  Musculoskeletal: Negative for arthralgias, back pain, gait problem and joint swelling.  Allergic/Immunologic: Negative for immunocompromised state.  Neurological: Negative for light-headedness and headaches.  Hematological: Does not bruise/bleed easily.  Psychiatric/Behavioral: Negative.       Objective:    Physical Exam  Constitutional: She is oriented to person, place, and time. She appears well-developed and well-nourished. No distress.  HENT:  Head: Normocephalic and atraumatic.  Right Ear: External ear  normal.  Left Ear: External ear normal.  Eyes: Conjunctivae are normal. Right eye exhibits no discharge. Left eye exhibits no discharge. No scleral icterus.  Neck: No JVD present. No tracheal deviation present.  Pulmonary/Chest: Effort normal. No stridor.  Neurological: She is alert and oriented to person, place, and time.  Skin: Skin is warm and dry. She is not diaphoretic.  Psychiatric: She has a normal mood and affect. Her behavior is normal.    BP 120/60   Pulse 76   Ht 5\' 3"  (1.6 m)   Wt 112 lb (50.8 kg)   SpO2 99%   BMI 19.84 kg/m  Wt Readings from Last 3 Encounters:  01/29/19 112 lb (50.8 kg)  12/28/18 110 lb (49.9 kg)  12/19/18 111 lb (50.3 kg)   BP Readings from Last 3 Encounters:  01/29/19 120/60  12/28/18 118/68  12/19/18 110/60   Guideline developer:  UpToDate (see UpToDate for funding source) Date Released: June 2014  Health Maintenance Due  Topic Date Due  . FOOT EXAM  02/04/1945  . OPHTHALMOLOGY EXAM  02/04/1945  . TETANUS/TDAP  02/04/1954  . PNA vac Low Risk Adult (1 of 2 - PCV13) 02/05/2000    There are no preventive care reminders to display for this patient.  Lab Results  Component Value Date   TSH 0.31 (L) 12/28/2018   Lab Results  Component Value Date   WBC 8.8 12/19/2018   HGB 10.9 Repeated and verified X2. (L) 12/19/2018   HCT 33.3 (L) 12/19/2018   MCV 95.7 12/19/2018   PLT 412.0 (H) 12/19/2018   Lab Results  Component Value Date   NA 140 12/19/2018   K 3.8 12/19/2018   CO2 29 12/19/2018   GLUCOSE 140 (H) 12/19/2018   BUN 16 12/19/2018   CREATININE 0.85 12/19/2018   BILITOT 0.5 12/19/2018   ALKPHOS 239 (H) 12/19/2018   AST 23 12/19/2018   ALT 31 12/19/2018   PROT 6.9 12/19/2018   ALBUMIN 3.5 12/19/2018   CALCIUM 9.7 12/19/2018   GFR 63.74 12/19/2018   Lab Results  Component Value Date   CHOL 213 (H) 12/20/2017   Lab Results  Component Value Date   HDL 67.20 12/20/2017   Lab Results  Component Value Date   LDLCALC  117 (H) 12/20/2017   Lab Results  Component Value Date   TRIG 144.0 12/20/2017   Lab Results  Component Value Date   CHOLHDL 3 12/20/2017   Lab Results  Component Value Date   HGBA1C 8.2 (H) 12/19/2018      Assessment & Plan:   Problem List Items Addressed This Visit      Endocrine   Diabetes mellitus without complication (HCC) - Primary   Relevant Medications  metFORMIN (GLUCOPHAGE) 500 MG tablet   Hyperthyroidism   Relevant Orders   TSH     Other   Elevated alkaline phosphatase level   Relevant Orders   Alkaline Phosphatase, Isoenzymes   Anemia   Relevant Orders   Fecal occult blood, imunochemical      Meds ordered this encounter  Medications  . metFORMIN (GLUCOPHAGE) 500 MG tablet    Sig: Take 1 tablet (500 mg total) by mouth 2 (two) times daily with a meal.    Dispense:  180 tablet    Refill:  3    Follow-up: Return in about 3 months (around 05/01/2019).   Reordered Hemoccult cards today.  Will consider a bone scan with alcohol line phosphatase isolate coming from the bone.

## 2019-01-29 NOTE — Addendum Note (Signed)
Addended by: Lynnea Ferrier on: 01/29/2019 02:43 PM   Modules accepted: Orders

## 2019-01-30 ENCOUNTER — Other Ambulatory Visit (INDEPENDENT_AMBULATORY_CARE_PROVIDER_SITE_OTHER): Payer: Medicare Other

## 2019-01-30 DIAGNOSIS — R748 Abnormal levels of other serum enzymes: Secondary | ICD-10-CM | POA: Diagnosis not present

## 2019-02-01 LAB — ALKALINE PHOSPHATASE, ISOENZYMES
Alkaline Phosphatase: 74 IU/L (ref 39–117)
BONE FRACTION: 30 % (ref 14–68)
INTESTINAL FRAC.: 0 % (ref 0–18)
LIVER FRACTION: 70 % (ref 18–85)

## 2019-03-01 ENCOUNTER — Other Ambulatory Visit: Payer: Self-pay

## 2019-03-01 ENCOUNTER — Ambulatory Visit
Admission: RE | Admit: 2019-03-01 | Discharge: 2019-03-01 | Disposition: A | Discharge status: Home / Self Care | Payer: Medicare Other | Source: Ambulatory Visit | Attending: Family Medicine | Admitting: Family Medicine

## 2019-03-01 DIAGNOSIS — Z1231 Encounter for screening mammogram for malignant neoplasm of breast: Secondary | ICD-10-CM

## 2019-05-06 ENCOUNTER — Ambulatory Visit: Payer: Medicare Other | Attending: Internal Medicine

## 2019-05-06 DIAGNOSIS — Z23 Encounter for immunization: Secondary | ICD-10-CM | POA: Insufficient documentation

## 2019-05-06 NOTE — Progress Notes (Signed)
   Covid-19 Vaccination Clinic  Name:  Lori Valdez    MRN: [id number] DOB: 06/03/1932  05/06/2019  [name] was observed post Covid-19 immunization for 15 minutes without incidence. She was provided with Vaccine Information Sheet and instruction to access the V-Safe system.   [name] was instructed to call 911 with any severe reactions post vaccine: Marland Kitchen Difficulty breathing  . Swelling of your face and throat  . A fast heartbeat  . A bad rash all over your body  . Dizziness and weakness    Immunizations Administered    Name Date Dose VIS Date Route   Pfizer COVID-19 Vaccine 05/06/2019 10:14 AM 0.3 mL 03/23/2019 Intramuscular   Manufacturer: [employer], [employer]   Lot: [id number]   NDC: [id number]

## 2019-05-28 ENCOUNTER — Ambulatory Visit: Payer: Medicare Other | Attending: Internal Medicine

## 2019-05-28 DIAGNOSIS — Z23 Encounter for immunization: Secondary | ICD-10-CM | POA: Insufficient documentation

## 2019-05-28 NOTE — Progress Notes (Signed)
   Covid-19 Vaccination Clinic  Name:  LATRICIA CERRITO    MRN: [id number] DOB: 1932/05/01  05/28/2019  Ms. Darrington was observed post Covid-19 immunization for 15 minutes without incidence. She was provided with Vaccine Information Sheet and instruction to access the V-Safe system.   Ms. Brostrom was instructed to call 911 with any severe reactions post vaccine: Marland Kitchen Difficulty breathing  . Swelling of your face and throat  . A fast heartbeat  . A bad rash all over your body  . Dizziness and weakness    Immunizations Administered    Name Date Dose VIS Date Route   Pfizer COVID-19 Vaccine 05/28/2019  9:28 AM 0.3 mL 03/23/2019 Intramuscular   Manufacturer: [employer], [employer]   Lot: [id number]   NDC: [address]-5

## 2019-12-12 ENCOUNTER — Other Ambulatory Visit: Payer: Self-pay | Admitting: Family Medicine

## 2020-01-29 ENCOUNTER — Other Ambulatory Visit: Payer: Self-pay | Admitting: Family Medicine

## 2020-01-29 ENCOUNTER — Encounter: Payer: Self-pay | Admitting: Family Medicine

## 2020-01-29 DIAGNOSIS — Z1231 Encounter for screening mammogram for malignant neoplasm of breast: Secondary | ICD-10-CM

## 2020-02-03 ENCOUNTER — Other Ambulatory Visit: Payer: Self-pay | Admitting: Family Medicine

## 2020-02-03 DIAGNOSIS — E119 Type 2 diabetes mellitus without complications: Secondary | ICD-10-CM

## 2020-03-02 ENCOUNTER — Other Ambulatory Visit: Payer: Self-pay | Admitting: Family Medicine

## 2020-03-12 NOTE — Telephone Encounter (Signed)
Called patient to schedule follow up appointment no answer LM to call back and schedule.  °

## 2020-03-13 ENCOUNTER — Other Ambulatory Visit: Payer: Self-pay

## 2020-03-13 ENCOUNTER — Ambulatory Visit
Admission: RE | Admit: 2020-03-13 | Discharge: 2020-03-13 | Disposition: A | Discharge status: Home / Self Care | Payer: Medicare Other | Source: Ambulatory Visit | Attending: Family Medicine | Admitting: Family Medicine

## 2020-03-13 DIAGNOSIS — Z1231 Encounter for screening mammogram for malignant neoplasm of breast: Secondary | ICD-10-CM

## 2020-08-21 ENCOUNTER — Other Ambulatory Visit: Payer: Self-pay | Admitting: Family Medicine

## 2020-08-21 DIAGNOSIS — E119 Type 2 diabetes mellitus without complications: Secondary | ICD-10-CM

## 2020-10-22 ENCOUNTER — Other Ambulatory Visit: Payer: Self-pay | Admitting: Family Medicine

## 2020-10-22 DIAGNOSIS — E119 Type 2 diabetes mellitus without complications: Secondary | ICD-10-CM

## 2020-10-22 NOTE — Telephone Encounter (Signed)
Not seen in over 19 months.

## 2021-02-02 ENCOUNTER — Other Ambulatory Visit: Payer: Self-pay | Admitting: Family Medicine

## 2021-02-02 ENCOUNTER — Other Ambulatory Visit: Payer: Self-pay | Admitting: Family

## 2021-02-02 DIAGNOSIS — E119 Type 2 diabetes mellitus without complications: Secondary | ICD-10-CM

## 2021-02-02 DIAGNOSIS — Z1231 Encounter for screening mammogram for malignant neoplasm of breast: Secondary | ICD-10-CM

## 2021-03-15 ENCOUNTER — Other Ambulatory Visit: Payer: Self-pay | Admitting: Family Medicine

## 2021-03-15 DIAGNOSIS — E119 Type 2 diabetes mellitus without complications: Secondary | ICD-10-CM

## 2021-03-19 ENCOUNTER — Ambulatory Visit: Payer: Medicare Other

## 2021-03-29 ENCOUNTER — Other Ambulatory Visit: Payer: Self-pay | Admitting: Family

## 2021-04-20 ENCOUNTER — Ambulatory Visit
Admission: RE | Admit: 2021-04-20 | Discharge: 2021-04-20 | Disposition: A | Discharge status: Home / Self Care | Payer: Medicare Other | Source: Ambulatory Visit | Attending: Family Medicine | Admitting: Family Medicine

## 2021-04-20 DIAGNOSIS — Z1231 Encounter for screening mammogram for malignant neoplasm of breast: Secondary | ICD-10-CM

## 2021-05-09 ENCOUNTER — Other Ambulatory Visit: Payer: Self-pay | Admitting: Family

## 2021-05-09 ENCOUNTER — Other Ambulatory Visit: Payer: Self-pay | Admitting: Family Medicine

## 2021-05-09 DIAGNOSIS — E119 Type 2 diabetes mellitus without complications: Secondary | ICD-10-CM

## 2021-05-27 NOTE — Telephone Encounter (Signed)
Called patient to schedule follow up was hung on during call assuming patient have switched PCP's.

## 2021-05-27 NOTE — Addendum Note (Signed)
Addended by: Lake Bells on: 05/27/2021 03:43 PM   Modules accepted: Orders

## 2021-07-13 ENCOUNTER — Other Ambulatory Visit: Payer: Self-pay | Admitting: Family Medicine

## 2021-07-13 DIAGNOSIS — E119 Type 2 diabetes mellitus without complications: Secondary | ICD-10-CM

## 2021-07-29 ENCOUNTER — Other Ambulatory Visit: Payer: Self-pay | Admitting: Family Medicine

## 2021-07-29 DIAGNOSIS — E119 Type 2 diabetes mellitus without complications: Secondary | ICD-10-CM

## 2021-08-04 ENCOUNTER — Encounter: Payer: Self-pay | Admitting: Family Medicine

## 2021-08-04 ENCOUNTER — Ambulatory Visit (INDEPENDENT_AMBULATORY_CARE_PROVIDER_SITE_OTHER): Payer: Medicare Other | Admitting: Family Medicine

## 2021-08-04 VITALS — BP 116/68 | HR 94 | Temp 97.4°F | Ht 63.0 in | Wt 112.4 lb

## 2021-08-04 DIAGNOSIS — R748 Abnormal levels of other serum enzymes: Secondary | ICD-10-CM | POA: Diagnosis not present

## 2021-08-04 DIAGNOSIS — E538 Deficiency of other specified B group vitamins: Secondary | ICD-10-CM

## 2021-08-04 DIAGNOSIS — E119 Type 2 diabetes mellitus without complications: Secondary | ICD-10-CM

## 2021-08-04 DIAGNOSIS — D649 Anemia, unspecified: Secondary | ICD-10-CM

## 2021-08-04 DIAGNOSIS — R7989 Other specified abnormal findings of blood chemistry: Secondary | ICD-10-CM

## 2021-08-04 DIAGNOSIS — E7800 Pure hypercholesterolemia, unspecified: Secondary | ICD-10-CM | POA: Diagnosis not present

## 2021-08-04 MED ORDER — METFORMIN HCL 500 MG PO TABS: 500.0000 mg | ORAL_TABLET | Freq: Two times a day (BID) | ORAL | 1 refills | Status: DC

## 2021-08-04 NOTE — Progress Notes (Signed)
? ?Established Patient Office Visit ? ?Subjective   ?Patient ID: Lori Valdez, female    DOB: 1932-05-22  Age: 88 y.o. MRN: [id number] ? ?Chief Complaint  ?Patient presents with  ? Follow-up  ?  Follow up on medications, no concerns.   ? ? ?HPI follow-up of type 2 diabetes, elevated alkaline phosphatase, depressed TSH normocytic anemia.  Has been lost to follow-up for almost 3 years.  Stayed away because of the pandemic.  Accompanied by her husband today.  Doing well feels energized.  Goes to the gym several days a week.  Palpitations.  Denies any type of bony pain abdominal pain nausea or vomiting.  Has not seen an eye doctor in several years.  Denies problems with her vision. ? ? ? ?Review of Systems  ?Constitutional:  Negative for chills, diaphoresis, malaise/fatigue and weight loss.  ?HENT: Negative.    ?Eyes: Negative.  Negative for blurred vision and double vision.  ?Respiratory: Negative.    ?Cardiovascular:  Negative for chest pain and palpitations.  ?Gastrointestinal:  Negative for abdominal pain, heartburn and vomiting.  ?Genitourinary: Negative.  Negative for dysuria, frequency and urgency.  ?Musculoskeletal:  Negative for back pain, falls, joint pain, myalgias and neck pain.  ?Neurological:  Negative for speech change, loss of consciousness and weakness.  ?Psychiatric/Behavioral: Negative.    ? ?  ?Objective:  ?  ? ?BP 116/68 (BP Location: Right Arm, Patient Position: Sitting, Cuff Size: Normal)   Pulse 94   Temp (!) 97.4 ?F (36.3 ?C) (Temporal)   Ht 5\' 3"  (1.6 m)   Wt 112 lb 6.4 oz (51 kg)   SpO2 96%   BMI 19.91 kg/m?  ? ? ?Physical Exam ?Constitutional:   ?   General: She is not in acute distress. ?   Appearance: Normal appearance. She is not ill-appearing, toxic-appearing or diaphoretic.  ?HENT:  ?   Head: Normocephalic and atraumatic.  ?   Right Ear: External ear normal.  ?   Left Ear: External ear normal.  ?   Mouth/Throat:  ?   Mouth: Mucous membranes are moist.  ?   Pharynx: Oropharynx  is clear. No oropharyngeal exudate or posterior oropharyngeal erythema.  ?Eyes:  ?   General: No scleral icterus.    ?   Right eye: No discharge.     ?   Left eye: No discharge.  ?   Extraocular Movements: Extraocular movements intact.  ?   Conjunctiva/sclera: Conjunctivae normal.  ?   Pupils: Pupils are equal, round, and reactive to light.  ?Cardiovascular:  ?   Rate and Rhythm: Normal rate and regular rhythm.  ?Pulmonary:  ?   Effort: Pulmonary effort is normal. No respiratory distress.  ?   Breath sounds: Normal breath sounds.  ?Abdominal:  ?   General: Bowel sounds are normal.  ?   Tenderness: There is no abdominal tenderness. There is no guarding.  ?Musculoskeletal:  ?   Cervical back: No rigidity, tenderness or bony tenderness.  ?   Thoracic back: No tenderness or bony tenderness.  ?   Lumbar back: No tenderness or bony tenderness.  ?   Right lower leg: No edema.  ?   Left lower leg: No edema.  ?Skin: ?   General: Skin is warm and dry.  ?Neurological:  ?   Mental Status: She is alert and oriented to person, place, and time.  ?Psychiatric:     ?   Mood and Affect: Mood normal.     ?  Behavior: Behavior normal.  ? ? ? ?No results found for any visits on 08/04/21. ? ? ? ?The ASCVD Risk score (Arnett DK, et al., 2019) failed to calculate for the following reasons: ?  The 2019 ASCVD risk score is only valid for ages 91 to 34 ? ?  ?Assessment & Plan:  ? ?Problem List Items Addressed This Visit   ? ?  ? Endocrine  ? Diabetes mellitus without complication (HCC)  ? Relevant Medications  ? metFORMIN (GLUCOPHAGE) 500 MG tablet  ? Other Relevant Orders  ? Hemoglobin A1c  ? Urinalysis, Routine w reflex microscopic  ? Microalbumin / creatinine urine ratio  ?  ? Other  ? Elevated LDL cholesterol level  ? Relevant Orders  ? Lipid panel  ? Elevated alkaline phosphatase level - Primary  ? Relevant Orders  ? Alkaline phosphatase, isoenzymes  ? Comprehensive metabolic panel  ? Anemia  ? Relevant Orders  ? CBC  ? Iron, TIBC and  Ferritin Panel  ? TSH  ? T3, free  ? T4, free  ? B12 deficiency  ? Relevant Orders  ? B12 and Folate Panel  ? ?Other Visit Diagnoses   ? ? Low TSH level      ? Relevant Orders  ? TSH  ? ?  ? ? ?Return in about 3 months (around 11/03/2021), or Return fasting as soon as possible for above ordered blood work and in 3 months..  We will schedule self for dilated eye exam. ? ? ?Mliss Sax, MD ? ?

## 2021-08-24 ENCOUNTER — Telehealth: Payer: Self-pay | Admitting: Family Medicine

## 2021-08-24 NOTE — Telephone Encounter (Signed)
Left message for patient to call back and schedule Medicare Annual Wellness Visit (AWV). Please offer to do virtually or by telephone.  Left office number and my jabber #[phone]. ? ?Due for AWVI ? ?Please schedule at anytime with Nurse Health Advisor. ?  ?

## 2021-09-14 ENCOUNTER — Telehealth: Payer: Self-pay | Admitting: Family Medicine

## 2021-09-14 NOTE — Telephone Encounter (Signed)
Left message for patient to call back and schedule Medicare Annual Wellness Visit (AWV).   Please offer to do virtually or by telephone.  Left office number and my jabber #[phone].  AWVI eligible as of 11/10/2016  Please schedule at anytime with Nurse Health Advisor.   

## 2021-10-09 ENCOUNTER — Telehealth: Payer: Self-pay | Admitting: Family Medicine

## 2021-10-09 NOTE — Telephone Encounter (Signed)
Left message for patient to call back and schedule Medicare Annual Wellness Visit (AWV).   Please offer to do virtually or by telephone.  Left office number and my jabber [phone].  AWVI eligible as of 11/10/2016  Please schedule at anytime with Nurse Health Advisor.

## 2021-10-28 ENCOUNTER — Ambulatory Visit (INDEPENDENT_AMBULATORY_CARE_PROVIDER_SITE_OTHER): Payer: Medicare Other

## 2021-10-28 DIAGNOSIS — Z0000 Encounter for general adult medical examination without abnormal findings: Secondary | ICD-10-CM

## 2021-10-28 NOTE — Patient Instructions (Signed)
Ms. Lori Valdez , Thank you for taking time to come for your Medicare Wellness Visit. I appreciate your ongoing commitment to your health goals. Please review the following plan we discussed and let me know if I can assist you in the future.   Screening recommendations/referrals: Colonoscopy: no longer required  Mammogram: no longer required  Bone Density: 01/02/2019 Recommended yearly ophthalmology/optometry visit for glaucoma screening and checkup Recommended yearly dental visit for hygiene and checkup  Vaccinations: Influenza vaccine: completed  Pneumococcal vaccine: completed  Tdap vaccine: 12/10/2018 Shingles vaccine: will consider     Advanced directives: yes   Conditions/risks identified: none   Next appointment: none    Preventive Care 65 Years and Older, Female Preventive care refers to lifestyle choices and visits with your health care provider that can promote health and wellness. What does preventive care include? A yearly physical exam. This is also called an annual well check. Dental exams once or twice a year. Routine eye exams. Ask your health care provider how often you should have your eyes checked. Personal lifestyle choices, including: Daily care of your teeth and gums. Regular physical activity. Eating a healthy diet. Avoiding tobacco and drug use. Limiting alcohol use. Practicing safe sex. Taking low-dose aspirin every day. Taking vitamin and mineral supplements as recommended by your health care provider. What happens during an annual well check? The services and screenings done by your health care provider during your annual well check will depend on your age, overall health, lifestyle risk factors, and family history of disease. Counseling  Your health care provider may ask you questions about your: Alcohol use. Tobacco use. Drug use. Emotional well-being. Home and relationship well-being. Sexual activity. Eating habits. History of falls. Memory  and ability to understand (cognition). Work and work Astronomer. Reproductive health. Screening  You may have the following tests or measurements: Height, weight, and BMI. Blood pressure. Lipid and cholesterol levels. These may be checked every 5 years, or more frequently if you are over 95 years old. Skin check. Lung cancer screening. You may have this screening every year starting at age 7 if you have a 30-pack-year history of smoking and currently smoke or have quit within the past 15 years. Fecal occult blood test (FOBT) of the stool. You may have this test every year starting at age 96. Flexible sigmoidoscopy or colonoscopy. You may have a sigmoidoscopy every 5 years or a colonoscopy every 10 years starting at age 40. Hepatitis C blood test. Hepatitis B blood test. Sexually transmitted disease (STD) testing. Diabetes screening. This is done by checking your blood sugar (glucose) after you have not eaten for a while (fasting). You may have this done every 1-3 years. Bone density scan. This is done to screen for osteoporosis. You may have this done starting at age 61. Mammogram. This may be done every 1-2 years. Talk to your health care provider about how often you should have regular mammograms. Talk with your health care provider about your test results, treatment options, and if necessary, the need for more tests. Vaccines  Your health care provider may recommend certain vaccines, such as: Influenza vaccine. This is recommended every year. Tetanus, diphtheria, and acellular pertussis (Tdap, Td) vaccine. You may need a Td booster every 10 years. Zoster vaccine. You may need this after age 10. Pneumococcal 13-valent conjugate (PCV13) vaccine. One dose is recommended after age 62. Pneumococcal polysaccharide (PPSV23) vaccine. One dose is recommended after age 57. Talk to your health care provider about which screenings and  vaccines you need and how often you need them. This  information is not intended to replace advice given to you by your health care provider. Make sure you discuss any questions you have with your health care provider. Document Released: 04/25/2015 Document Revised: 12/17/2015 Document Reviewed: 01/28/2015 Elsevier Interactive Patient Education  2017 [employer] Prevention in the Home Falls can cause injuries. They can happen to people of all ages. There are many things you can do to make your home safe and to help prevent falls. What can I do on the outside of my home? Regularly fix the edges of walkways and driveways and fix any cracks. Remove anything that might make you trip as you walk through a door, such as a raised step or threshold. Trim any bushes or trees on the path to your home. Use bright outdoor lighting. Clear any walking paths of anything that might make someone trip, such as rocks or tools. Regularly check to see if handrails are loose or broken. Make sure that both sides of any steps have handrails. Any raised decks and porches should have guardrails on the edges. Have any leaves, snow, or ice cleared regularly. Use sand or salt on walking paths during winter. Clean up any spills in your garage right away. This includes oil or grease spills. What can I do in the bathroom? Use night lights. Install grab bars by the toilet and in the tub and shower. Do not use towel bars as grab bars. Use non-skid mats or decals in the tub or shower. If you need to sit down in the shower, use a plastic, non-slip stool. Keep the floor dry. Clean up any water that spills on the floor as soon as it happens. Remove soap buildup in the tub or shower regularly. Attach bath mats securely with double-sided non-slip rug tape. Do not have throw rugs and other things on the floor that can make you trip. What can I do in the bedroom? Use night lights. Make sure that you have a light by your bed that is easy to reach. Do not use any sheets or  blankets that are too big for your bed. They should not hang down onto the floor. Have a firm chair that has side arms. You can use this for support while you get dressed. Do not have throw rugs and other things on the floor that can make you trip. What can I do in the kitchen? Clean up any spills right away. Avoid walking on wet floors. Keep items that you use a lot in easy-to-reach places. If you need to reach something above you, use a strong step stool that has a grab bar. Keep electrical cords out of the way. Do not use floor polish or wax that makes floors slippery. If you must use wax, use non-skid floor wax. Do not have throw rugs and other things on the floor that can make you trip. What can I do with my stairs? Do not leave any items on the stairs. Make sure that there are handrails on both sides of the stairs and use them. Fix handrails that are broken or loose. Make sure that handrails are as long as the stairways. Check any carpeting to make sure that it is firmly attached to the stairs. Fix any carpet that is loose or worn. Avoid having throw rugs at the top or bottom of the stairs. If you do have throw rugs, attach them to the floor with carpet tape. Make  sure that you have a light switch at the top of the stairs and the bottom of the stairs. If you do not have them, ask someone to add them for you. What else can I do to help prevent falls? Wear shoes that: Do not have high heels. Have rubber bottoms. Are comfortable and fit you well. Are closed at the toe. Do not wear sandals. If you use a stepladder: Make sure that it is fully opened. Do not climb a closed stepladder. Make sure that both sides of the stepladder are locked into place. Ask someone to hold it for you, if possible. Clearly mark and make sure that you can see: Any grab bars or handrails. First and last steps. Where the edge of each step is. Use tools that help you move around (mobility aids) if they are  needed. These include: Canes. Walkers. Scooters. Crutches. Turn on the lights when you go into a dark area. Replace any light bulbs as soon as they burn out. Set up your furniture so you have a clear path. Avoid moving your furniture around. If any of your floors are uneven, fix them. If there are any pets around you, be aware of where they are. Review your medicines with your doctor. Some medicines can make you feel dizzy. This can increase your chance of falling. Ask your doctor what other things that you can do to help prevent falls. This information is not intended to replace advice given to you by your health care provider. Make sure you discuss any questions you have with your health care provider. Document Released: 01/23/2009 Document Revised: 09/04/2015 Document Reviewed: 05/03/2014 Elsevier Interactive Patient Education  2017 [employer].

## 2021-10-28 NOTE — Progress Notes (Signed)
Subjective:   Lori Valdez is a 88 y.o. female who presents for Medicare Annual (Subsequent) preventive examination.   I connected with Lori Valdez  today by telephone and verified that I am speaking with the correct person using two identifiers. Location patient: home Location provider: work Persons participating in the virtual visit: patient, provider.   I discussed the limitations, risks, security and privacy concerns of performing an evaluation and management service by telephone and the availability of in person appointments. I also discussed with the patient that there may be a patient responsible charge related to this service. The patient expressed understanding and verbally consented to this telephonic visit.    Interactive audio and video telecommunications were attempted between this provider and patient, however failed, due to patient having technical difficulties OR patient did not have access to video capability.  We continued and completed visit with audio only.    Review of Systems     Cardiac Risk Factors include: advanced age (>71men, >13 women)     Objective:    Today's Vitals   There is no height or weight on file to calculate BMI.     10/28/2021    3:46 PM 10/16/2016    2:20 AM  Advanced Directives  Does Patient Have a Medical Advance Directive? Yes No  Type of Estate agent of [place];Living will   Copy of Healthcare Power of Attorney in Chart? No - copy requested   Would patient like information on creating a medical advance directive?  No - Patient declined    Current Medications (verified) Outpatient Encounter Medications as of 10/28/2021  Medication Sig   metFORMIN (GLUCOPHAGE) 500 MG tablet Take 1 tablet (500 mg total) by mouth 2 (two) times daily with a meal.   Multiple Vitamin (MULTIVITAMIN WITH MINERALS) TABS tablet Take 1 tablet by mouth daily.   Omega-3 Fatty Acids (FISH OIL) 1000 MG CAPS Take 1,000 mg by mouth  daily.    simvastatin (ZOCOR) 20 MG tablet TAKE 1 TABLET BY MOUTH  DAILY AT 6PM   No facility-administered encounter medications on file as of 10/28/2021.    Allergies (verified) Patient has no known allergies.   History: History reviewed. No pertinent past medical history. History reviewed. No pertinent surgical history. Family History  Problem Relation Age of Onset   Breast cancer Neg Hx    Social History   Socioeconomic History   Marital status: Married    Spouse name: Not on file   Number of children: Not on file   Years of education: Not on file   Highest education level: Not on file  Occupational History   Not on file  Tobacco Use   Smoking status: Never   Smokeless tobacco: Never  Vaping Use   Vaping Use: Never used  Substance and Sexual Activity   Alcohol use: Yes    Comment: drinks 2 scotch and waters nightly   Drug use: Not on file   Sexual activity: Not on file  Other Topics Concern   Not on file  Social History Narrative   Not on file   Social Determinants of Health   Financial Resource Strain: Low Risk  (10/28/2021)   Overall Financial Resource Strain (CARDIA)    Difficulty of Paying Living Expenses: Not hard at all  Food Insecurity: No Food Insecurity (10/28/2021)   Hunger Vital Sign    Worried About Running Out of Food in the Last Year: Never true    Ran Out of Food  in the Last Year: Never true  Transportation Needs: No Transportation Needs (10/28/2021)   PRAPARE - Administrator, Civil Service (Medical): No    Lack of Transportation (Non-Medical): No  Physical Activity: Sufficiently Active (10/28/2021)   Exercise Vital Sign    Days of Exercise per Week: 5 days    Minutes of Exercise per Session: 60 min  Stress: No Stress Concern Present (10/28/2021)   [employer] of Occupational Health - Occupational Stress Questionnaire    Feeling of Stress : Not at all  Social Connections: Moderately Integrated (10/28/2021)   Social Connection  and Isolation Panel [NHANES]    Frequency of Communication with Friends and Family: Three times a week    Frequency of Social Gatherings with Friends and Family: Three times a week    Attends Religious Services: More than 4 times per year    Active Member of Clubs or Organizations: No    Attends Banker Meetings: Never    Marital Status: Married    Tobacco Counseling Counseling given: Not Answered   Clinical Intake:  Pre-visit preparation completed: Yes  Pain : No/denies pain     Nutritional Risks: None Diabetes: No  How often do you need to have someone help you when you read instructions, pamphlets, or other written materials from your doctor or pharmacy?: 1 - Never What is the last grade level you completed in school?: college  Diabetic?no   Interpreter Needed?: No  Information entered by :: L.Austen Oyster,LPN   Activities of Daily Living    10/28/2021    3:46 PM 10/27/2021   11:24 PM  In your present state of health, do you have any difficulty performing the following activities:  Hearing? 0   Vision? 0 0  Difficulty concentrating or making decisions? 0 0  Walking or climbing stairs? 0 0  Dressing or bathing? 0 0  Doing errands, shopping? 0 0  Preparing Food and eating ? N N  Using the Toilet? N N  In the past six months, have you accidently leaked urine? N N  Do you have problems with loss of bowel control? N N  Managing your Medications? N N  Managing your Finances? N N  Housekeeping or managing your Housekeeping?  N    Patient Care Team: Mliss Sax, MD as PCP - General (Family Medicine)  Indicate any recent Medical Services you may have received from other than [facility] providers in the past year (date may be approximate).     Assessment:   This is a routine wellness examination for Lori Valdez.  Hearing/Vision screen Vision Screening - Comments:: Annual eye exams wear glasses   Dietary issues and exercise activities  discussed: Current Exercise Habits: Home exercise routine, Type of exercise: walking, Time (Minutes): 60, Frequency (Times/Week): 5, Weekly Exercise (Minutes/Week): 300, Intensity: Mild, Exercise limited by: None identified   Goals Addressed   None    Depression Screen    10/28/2021    3:46 PM 10/28/2021    3:44 PM 08/04/2021    3:22 PM 12/20/2017    1:03 PM  PHQ 2/9 Scores  PHQ - 2 Score 0 0 0 0    Fall Risk    10/28/2021    3:47 PM 10/27/2021   11:24 PM 08/04/2021    3:22 PM 12/20/2017    1:03 PM  Fall Risk   Falls in the past year? 0 0 0 No  Number falls in past yr: 0 0 0   Injury with  Fall? 0 0    Follow up Falls evaluation completed;Education provided       FALL RISK PREVENTION PERTAINING TO THE HOME:  Any stairs in or around the home? Yes  If so, are there any without handrails? No  Home free of loose throw rugs in walkways, pet beds, electrical cords, etc? Yes  Adequate lighting in your home to reduce risk of falls? Yes   ASSISTIVE DEVICES UTILIZED TO PREVENT FALLS:  Life alert? No  Use of a cane, walker or w/c? No  Grab bars in the bathroom? Yes  Shower chair or bench in shower? Yes  Elevated toilet seat or a handicapped toilet? Yes     Cognitive Function:  Normal cognitive status assessed by telephone conversation  by this Nurse Health Advisor. No abnormalities found.        Immunizations Immunization History  Administered Date(s) Administered   Influenza, High Dose Seasonal PF 03/02/2018, 11/29/2018   Influenza-Unspecified 11/29/2018   PFIZER(Purple Top)SARS-COV-2 Vaccination 05/06/2019, 05/28/2019, 01/29/2020   Tdap 12/09/2016    TDAP status: Up to date  Flu Vaccine status: Up to date  Pneumococcal vaccine status: Due, Education has been provided regarding the importance of this vaccine. Advised may receive this vaccine at local pharmacy or Health Dept. Aware to provide a copy of the vaccination record if obtained from local pharmacy or Health  Dept. Verbalized acceptance and understanding.  Covid-19 vaccine status: Completed vaccines  Qualifies for Shingles Vaccine? Yes   Zostavax completed No   Shingrix Completed?: No.    Education has been provided regarding the importance of this vaccine. Patient has been advised to call insurance company to determine out of pocket expense if they have not yet received this vaccine. Advised may also receive vaccine at local pharmacy or Health Dept. Verbalized acceptance and understanding.  Screening Tests Health Maintenance  Topic Date Due   FOOT EXAM  Never done   HEMOGLOBIN A1C  06/18/2019   URINE MICROALBUMIN  12/19/2019   Zoster Vaccines- Shingrix (1 of 2) 11/03/2021 (Originally 02/04/1983)   INFLUENZA VACCINE  11/10/2021   TETANUS/TDAP  12/10/2026   DEXA SCAN  Completed   HPV VACCINES  Aged Out   Pneumonia Vaccine 58+ Years old  Discontinued   OPHTHALMOLOGY EXAM  Discontinued   COVID-19 Vaccine  Discontinued    Health Maintenance  Health Maintenance Due  Topic Date Due   FOOT EXAM  Never done   HEMOGLOBIN A1C  06/18/2019   URINE MICROALBUMIN  12/19/2019    Colorectal cancer screening: No longer required.   Mammogram status: No longer required due to age.  Bone Density status: Completed 01/02/2019. Results reflect: Bone density results: OSTEOPENIA. Repeat every 5 years.  Lung Cancer Screening: (Low Dose CT Chest recommended if Age 30-80 years, 30 pack-year currently smoking OR have quit w/in 15years.) does not qualify.   Lung Cancer Screening Referral:    Additional Screening:  Hepatitis C Screening: does not qualify;  Vision Screening: Recommended annual ophthalmology exams for early detection of glaucoma and other disorders of the eye. Is the patient up to date with their annual eye exam?  Yes  Who is the provider or what is the name of the office in which the patient attends annual eye exams? [facility]  If pt is not established with a provider, would they like  to be referred to a provider to establish care? No .   Dental Screening: Recommended annual dental exams for proper oral hygiene  Community Resource Referral /  Chronic Care Management: CRR required this visit?  No   CCM required this visit?  No      Plan:     I have personally reviewed and noted the following in the patient's chart:   Medical and social history Use of alcohol, tobacco or illicit drugs  Current medications and supplements including opioid prescriptions.  Functional ability and status Nutritional status Physical activity Advanced directives List of other physicians Hospitalizations, surgeries, and ER visits in previous 12 months Vitals Screenings to include cognitive, depression, and falls Referrals and appointments  In addition, I have reviewed and discussed with patient certain preventive protocols, quality metrics, and best practice recommendations. A written personalized care plan for preventive services as well as general preventive health recommendations were provided to patient.     March Rummage, LPN   2/29/7989   Nurse Notes: none

## 2022-02-10 ENCOUNTER — Other Ambulatory Visit: Payer: Self-pay | Admitting: Family Medicine

## 2022-02-10 DIAGNOSIS — E119 Type 2 diabetes mellitus without complications: Secondary | ICD-10-CM

## 2022-04-03 ENCOUNTER — Other Ambulatory Visit: Payer: Self-pay | Admitting: Family Medicine

## 2022-04-03 DIAGNOSIS — E119 Type 2 diabetes mellitus without complications: Secondary | ICD-10-CM

## 2022-04-24 ENCOUNTER — Other Ambulatory Visit: Payer: Self-pay | Admitting: Family Medicine

## 2022-06-15 ENCOUNTER — Other Ambulatory Visit: Payer: Self-pay | Admitting: Family Medicine

## 2022-06-15 DIAGNOSIS — E119 Type 2 diabetes mellitus without complications: Secondary | ICD-10-CM

## 2022-07-27 ENCOUNTER — Other Ambulatory Visit: Payer: Self-pay | Admitting: Family Medicine

## 2022-07-27 DIAGNOSIS — E119 Type 2 diabetes mellitus without complications: Secondary | ICD-10-CM

## 2022-08-17 ENCOUNTER — Other Ambulatory Visit: Payer: Self-pay | Admitting: Family Medicine

## 2022-09-09 ENCOUNTER — Encounter: Payer: Self-pay | Admitting: Emergency Medicine

## 2022-09-09 ENCOUNTER — Ambulatory Visit
Admission: EM | Admit: 2022-09-09 | Discharge: 2022-09-09 | Disposition: A | Discharge status: Home / Self Care | Payer: Medicare Other | Attending: Family Medicine | Admitting: Family Medicine

## 2022-09-09 ENCOUNTER — Other Ambulatory Visit: Payer: Self-pay

## 2022-09-09 DIAGNOSIS — W57XXXA Bitten or stung by nonvenomous insect and other nonvenomous arthropods, initial encounter: Secondary | ICD-10-CM

## 2022-09-09 DIAGNOSIS — S30861A Insect bite (nonvenomous) of abdominal wall, initial encounter: Secondary | ICD-10-CM

## 2022-09-09 DIAGNOSIS — L03311 Cellulitis of abdominal wall: Secondary | ICD-10-CM

## 2022-09-09 MED ORDER — DOXYCYCLINE HYCLATE 100 MG PO CAPS: 100.0000 mg | ORAL_CAPSULE | Freq: Two times a day (BID) | ORAL | 0 refills | Status: AC

## 2022-09-09 NOTE — ED Triage Notes (Signed)
Patient presents after finding a tick on her right abdomen.  Husband was able to remove it, thinks he got the head.  Patient denies fevers, chills, pain, malaise.

## 2022-09-09 NOTE — ED Provider Notes (Signed)
[facility]    CSN: [id number] Arrival date & time: 09/09/22  1319      History   Chief Complaint Chief Complaint  Patient presents with   Tick Removal    HPI Lori Valdez is a 89 y.o. female.   HPI Here for redness and a swollen spot on her right abdomen.  2 days ago her husband found a tick on her upper abdomen and removed it.  It was not engorged at the time.  Today they noted that the redness has spread.  No fever or chills or nausea or vomiting.  She has no allergies to medications   She is not itching and she is not short of breath.  History reviewed. No pertinent past medical history.  Patient Active Problem List   Diagnosis Date Noted   Elevated alkaline phosphatase level 12/28/2018   Anemia 12/28/2018   Estrogen deficiency 12/28/2018   B12 deficiency 12/28/2018   Hyperthyroidism 12/28/2018   History of fever 12/19/2018   Elevated LDL cholesterol level 12/20/2017   Diabetes mellitus without complication (HCC) 12/20/2017    History reviewed. No pertinent surgical history.  OB History   No obstetric history on file.      Home Medications    Prior to Admission medications   Medication Sig Start Date End Date Taking? Authorizing Provider  doxycycline (VIBRAMYCIN) 100 MG capsule Take 1 capsule (100 mg total) by mouth 2 (two) times daily for 7 days. 09/09/22 09/16/22 Yes Zenia Resides, MD  metFORMIN (GLUCOPHAGE) 500 MG tablet TAKE 1 TABLET BY MOUTH TWICE  DAILY WITH MEALS 07/27/22   Mliss Sax, MD  Multiple Vitamin (MULTIVITAMIN WITH MINERALS) TABS tablet Take 1 tablet by mouth daily.    [provider]  Omega-3 Fatty Acids (FISH OIL) 1000 MG CAPS Take 1,000 mg by mouth daily.     [provider]  simvastatin (ZOCOR) 20 MG tablet Take 1 tablet (20 mg total) by mouth at bedtime. 08/17/22   Mliss Sax, MD    Family History Family History  Problem Relation Age of Onset   Healthy Mother     Healthy Father    Breast cancer Neg Hx     Social History Social History   Tobacco Use   Smoking status: Never   Smokeless tobacco: Never  Vaping Use   Vaping Use: Never used  Substance Use Topics   Alcohol use: Yes    Comment: drinks 2 scotch and waters nightly   Drug use: Never     Allergies   Patient has no known allergies.   Review of Systems Review of Systems   Physical Exam Triage Vital Signs ED Triage Vitals  Enc Vitals Group     BP 09/09/22 1338 116/67     Pulse Rate 09/09/22 1338 73     Resp 09/09/22 1338 18     Temp 09/09/22 1338 98.3 F (36.8 C)     Temp src --      SpO2 09/09/22 1338 94 %     Weight --      Height --      Head Circumference --      Peak Flow --      Pain Score 09/09/22 1340 0     Pain Loc --      Pain Edu? --      Excl. in GC? --    No data found.  Updated Vital Signs BP 116/67 (BP Location: Left Arm)  Pulse 73   Temp 98.3 F (36.8 C)   Resp 18   SpO2 94%   Visual Acuity Right Eye Distance:   Left Eye Distance:   Bilateral Distance:    Right Eye Near:   Left Eye Near:    Bilateral Near:     Physical Exam Vitals reviewed.  Constitutional:      General: She is not in acute distress.    Appearance: She is not ill-appearing, toxic-appearing or diaphoretic.  HENT:     Mouth/Throat:     Mouth: Mucous membranes are moist.  Cardiovascular:     Rate and Rhythm: Normal rate and regular rhythm.  Musculoskeletal:     Cervical back: Neck supple.  Lymphadenopathy:     Cervical: No cervical adenopathy.  Skin:    Coloration: Skin is not jaundiced or pale.     Comments: There is an area of erythema about 1.5 cm in diameter.  It is just a little indurated in the center.  There is no fluctuance.  This is on her right upper abdomen.  Neurological:     Mental Status: She is alert.      UC Treatments / Results  Labs (all labs ordered are listed, but only abnormal results are displayed) Labs Reviewed - No data to  display  EKG   Radiology No results found.  Procedures Procedures (including critical care time)  Medications Ordered in UC Medications - No data to display  Initial Impression / Assessment and Plan / UC Course  I have reviewed the triage vital signs and the nursing notes.  Pertinent labs & imaging results that were available during my care of the patient were reviewed by me and considered in my medical decision making (see chart for details).        Doxycycline is sent into the pharmacy for possible cellulitis and to prevent Lyme disease. Final Clinical Impressions(s) / UC Diagnoses   Final diagnoses:  Cellulitis of abdominal wall  Tick bite of abdominal wall, initial encounter     Discharge Instructions      Take doxycycline 100 mg --1 capsule 2 times daily for 7 days       ED Prescriptions     Medication Sig Dispense Auth. Provider   doxycycline (VIBRAMYCIN) 100 MG capsule Take 1 capsule (100 mg total) by mouth 2 (two) times daily for 7 days. 14 capsule Marlinda Mike, Janace Aris, MD      PDMP not reviewed this encounter.   Zenia Resides, MD 09/09/22 1351

## 2022-09-09 NOTE — Discharge Instructions (Signed)
Take doxycycline 100 mg--1 capsule 2 times daily for 7 days 

## 2022-09-27 ENCOUNTER — Ambulatory Visit
Admission: EM | Admit: 2022-09-27 | Discharge: 2022-09-27 | Disposition: A | Discharge status: Home / Self Care | Payer: Medicare Other | Attending: Nurse Practitioner | Admitting: Nurse Practitioner

## 2022-09-27 DIAGNOSIS — S40261A Insect bite (nonvenomous) of right shoulder, initial encounter: Secondary | ICD-10-CM

## 2022-09-27 DIAGNOSIS — W57XXXA Bitten or stung by nonvenomous insect and other nonvenomous arthropods, initial encounter: Secondary | ICD-10-CM | POA: Diagnosis not present

## 2022-09-27 MED ORDER — DOXYCYCLINE HYCLATE 100 MG PO CAPS
100.0000 mg | ORAL_CAPSULE | Freq: Two times a day (BID) | ORAL | 0 refills | Status: AC
Start: 2022-09-27 — End: ?

## 2022-09-27 NOTE — ED Provider Notes (Signed)
[facility]    CSN: [id number] Arrival date & time: 09/27/22  1509      History   Chief Complaint No chief complaint on file.   HPI Lori Valdez is a 89 y.o. female.   Subjective:   Lori Valdez is a 89 y.o. female who presents for evaluation of rash to the right shoulder area.  Patient reports that her husband removed a tick from that site 3 days ago.  The area is increasing in redness and itching.  She denies any fevers, chills, body aches, headache, neck stiffness or neck pain.  Notably, patient was treated on 09/09/2022 for a tick bite as well.  She completed a 7-day course of doxycycline.  Denies any prior history of Lyme Disease.    The following portions of the patient's history were reviewed and updated as appropriate: allergies, current medications, past family history, past medical history, past social history, past surgical history, and problem list.        History reviewed. No pertinent past medical history.  Patient Active Problem List   Diagnosis Date Noted   Elevated alkaline phosphatase level 12/28/2018   Anemia 12/28/2018   Estrogen deficiency 12/28/2018   B12 deficiency 12/28/2018   Hyperthyroidism 12/28/2018   History of fever 12/19/2018   Elevated LDL cholesterol level 12/20/2017   Diabetes mellitus without complication (HCC) 12/20/2017    History reviewed. No pertinent surgical history.  OB History   No obstetric history on file.      Home Medications    Prior to Admission medications   Medication Sig Start Date End Date Taking? Authorizing Provider  doxycycline (VIBRAMYCIN) 100 MG capsule Take 1 capsule (100 mg total) by mouth 2 (two) times daily. 09/27/22  Yes Lurline Idol, FNP  metFORMIN (GLUCOPHAGE) 500 MG tablet TAKE 1 TABLET BY MOUTH TWICE  DAILY WITH MEALS 07/27/22   Mliss Sax, MD  Multiple Vitamin (MULTIVITAMIN WITH MINERALS) TABS tablet Take 1 tablet by mouth daily.    [provider]   Omega-3 Fatty Acids (FISH OIL) 1000 MG CAPS Take 1,000 mg by mouth daily.     [provider]  simvastatin (ZOCOR) 20 MG tablet Take 1 tablet (20 mg total) by mouth at bedtime. 08/17/22   Mliss Sax, MD    Family History Family History  Problem Relation Age of Onset   Healthy Mother    Healthy Father    Breast cancer Neg Hx     Social History Social History   Tobacco Use   Smoking status: Never   Smokeless tobacco: Never  Vaping Use   Vaping Use: Never used  Substance Use Topics   Alcohol use: Yes    Comment: drinks 2 scotch and waters nightly   Drug use: Never     Allergies   Patient has no known allergies.   Review of Systems Review of Systems  Constitutional:  Negative for chills, diaphoresis, fatigue and fever.  Gastrointestinal:  Negative for nausea and vomiting.  Musculoskeletal:  Negative for myalgias, neck pain and neck stiffness.  Skin:  Positive for wound.  Neurological:  Negative for dizziness and headaches.  All other systems reviewed and are negative.    Physical Exam Triage Vital Signs ED Triage Vitals  Enc Vitals Group     BP 09/27/22 1522 (!) 124/59     Pulse Rate 09/27/22 1522 79     Resp 09/27/22 1522 15     Temp 09/27/22 1522 98.1 F (36.7 C)  Temp Source 09/27/22 1522 Oral     SpO2 09/27/22 1522 98 %     Weight --      Height --      Head Circumference --      Peak Flow --      Pain Score 09/27/22 1526 0     Pain Loc --      Pain Edu? --      Excl. in GC? --    No data found.  Updated Vital Signs BP (!) 124/59 (BP Location: Left Arm)   Pulse 79   Temp 98.1 F (36.7 C) (Oral)   Resp 15   SpO2 98%   Visual Acuity Right Eye Distance:   Left Eye Distance:   Bilateral Distance:    Right Eye Near:   Left Eye Near:    Bilateral Near:     Physical Exam Vitals reviewed.  Constitutional:      Appearance: Normal appearance.  HENT:     Head: Normocephalic.     Nose: Nose normal.  Eyes:      Conjunctiva/sclera: Conjunctivae normal.  Cardiovascular:     Rate and Rhythm: Normal rate.  Pulmonary:     Effort: Pulmonary effort is normal.     Breath sounds: Normal breath sounds.  Musculoskeletal:        General: Normal range of motion.     Cervical back: Normal range of motion and neck supple.  Skin:    General: Skin is warm and dry.     Findings: Wound present.     Comments: Pinpoint lesion noted to the anterior shoulder with surrounding erythema.  Retained foreign body noted.  No central clearing.  See picture below.  Neurological:     General: No focal deficit present.     Mental Status: She is alert and oriented to person, place, and time.  Psychiatric:        Mood and Affect: Mood normal.        Behavior: Behavior normal.      UC Treatments / Results  Labs (all labs ordered are listed, but only abnormal results are displayed) Labs Reviewed  LYME DISEASE SEROLOGY W/REFLEX    EKG   Radiology No results found.  Procedures Procedures (including critical care time)  Medications Ordered in UC Medications - No data to display  Initial Impression / Assessment and Plan / UC Course  I have reviewed the triage vital signs and the nursing notes.  Pertinent labs & imaging results that were available during my care of the patient were reviewed by me and considered in my medical decision making (see chart for details).    89 yo female presenting with an erythematous rash to the right shoulder secondary to a tick bite.  Patient is afebrile.  Nontoxic.  Physical exam as above.  No central clearing or necrosis noted.  Patient had a tick bite a couple of weeks ago to the abdomen as well.  Will check a Lyme panel and start a 10-day course of doxycycline.  Monitoring parameters and indication for follow-up discussed with patient and husband.  Today's evaluation has revealed no signs of a dangerous process. Discussed diagnosis with patient and/or guardian. Patient and/or  guardian aware of their diagnosis, possible red flag symptoms to watch out for and need for close follow up. Patient and/or guardian understands verbal and written discharge instructions. Patient and/or guardian comfortable with plan and disposition.  Patient and/or guardian has a clear mental status at this time,  good insight into illness (after discussion and teaching) and has clear judgment to make decisions regarding their care  Documentation was completed with the aid of voice recognition software. Transcription may contain typographical errors. Final Clinical Impressions(s) / UC Diagnoses   Final diagnoses:  Tick bite of right shoulder, initial encounter     Discharge Instructions      You have been seen today for tick bite.  I have started you on antibiotics; make sure you finish them I am also checking your blood today for lyme disease Keep the bite area clean and dry  Monitor yourself closely   Get help right away if:  You have a fever or chills. You have a red rash that makes a circle (bull's-eye rash) in the bite area. You have redness and swelling where the tick bit you. You have a headache or stiff neck. You have pain in a muscle, joint, or bone. You are more tired than normal. You have trouble walking or moving your legs. You have numbness in your legs. You have tender or swollen lymph glands. You have belly (abdominal) pain, vomiting, watery poop (diarrhea), or weight loss.     ED Prescriptions     Medication Sig Dispense Auth. Provider   doxycycline (VIBRAMYCIN) 100 MG capsule Take 1 capsule (100 mg total) by mouth 2 (two) times daily. 20 capsule Lurline Idol, FNP      PDMP not reviewed this encounter.   Lurline Idol, [place] 09/27/22 1549

## 2022-09-27 NOTE — Discharge Instructions (Addendum)
You have been seen today for tick bite.  I have started you on antibiotics; make sure you finish them I am also checking your blood today for lyme disease Keep the bite area clean and dry  Monitor yourself closely   Get help right away if:  You have a fever or chills. You have a red rash that makes a circle (bull's-eye rash) in the bite area. You have redness and swelling where the tick bit you. You have a headache or stiff neck. You have pain in a muscle, joint, or bone. You are more tired than normal. You have trouble walking or moving your legs. You have numbness in your legs. You have tender or swollen lymph glands. You have belly (abdominal) pain, vomiting, watery poop (diarrhea), or weight loss.

## 2022-09-27 NOTE — ED Triage Notes (Signed)
Pt c/o tick bite, pt states she thought she and her husband had gotten it all out but it appears to have become inflamed. The husband states he things when he was pulling it out the tongs remainder in her skin. This occurred Friday

## 2022-09-28 LAB — LYME DISEASE SEROLOGY W/REFLEX: Lyme Total Antibody EIA: NEGATIVE

## 2022-09-29 ENCOUNTER — Other Ambulatory Visit (INDEPENDENT_AMBULATORY_CARE_PROVIDER_SITE_OTHER): Payer: Medicare Other

## 2022-09-29 ENCOUNTER — Ambulatory Visit (INDEPENDENT_AMBULATORY_CARE_PROVIDER_SITE_OTHER): Payer: Medicare Other | Admitting: Orthopaedic Surgery

## 2022-09-29 DIAGNOSIS — M1711 Unilateral primary osteoarthritis, right knee: Secondary | ICD-10-CM | POA: Diagnosis not present

## 2022-09-29 DIAGNOSIS — M25561 Pain in right knee: Secondary | ICD-10-CM

## 2022-09-29 NOTE — Progress Notes (Signed)
The patient comes in today for right knee pain.  She says she has been hurting since about May 14 when she was in the garden and felt pain in the knee.  She says is not getting any better but denies any swelling.  She says it is more of activity related and it does not wake her up at night.  She is never had surgery on that knee and has never had injections in the knee.  She is a thin 89 year old female.  Her husband is with her today.  Examination of both knees shows no significant deformities.  There is no effusion.  There is some slight varus mall alignment of the knees but it is easily correctable.  Both knees are ligamentously stable.  The right knee does have medial joint line tenderness.  X-rays of the right knee shows tricompartment arthritis with mainly medial joint space narrowing.  I did explain in detail that she does have arthritis in her knee and what the treatment options are.  Right now we are just going to try knee conditioning exercises since she does not hurt on a daily basis.  I have suggested she consider a copper fit knee sleeve to look into it as well.  Her husband understands this as well.  All questions and concerns were answered and addressed.  Follow-up can be as needed.  She understands if things worsen do not hesitate to come back and see [place].

## 2022-11-18 ENCOUNTER — Other Ambulatory Visit: Payer: Self-pay | Admitting: Family Medicine

## 2022-12-29 ENCOUNTER — Other Ambulatory Visit: Payer: Self-pay | Admitting: Family Medicine

## 2022-12-29 DIAGNOSIS — E119 Type 2 diabetes mellitus without complications: Secondary | ICD-10-CM

## 2023-02-02 ENCOUNTER — Other Ambulatory Visit: Payer: Self-pay | Admitting: Family Medicine

## 2023-03-14 ENCOUNTER — Encounter: Payer: Self-pay | Admitting: Family Medicine

## 2023-04-26 ENCOUNTER — Ambulatory Visit (INDEPENDENT_AMBULATORY_CARE_PROVIDER_SITE_OTHER): Payer: Medicare Other | Admitting: Family Medicine

## 2023-04-26 ENCOUNTER — Encounter: Payer: Self-pay | Admitting: Family Medicine

## 2023-04-26 VITALS — BP 130/68 | HR 68 | Temp 98.1°F | Ht 63.0 in | Wt 118.2 lb

## 2023-04-26 DIAGNOSIS — Z0000 Encounter for general adult medical examination without abnormal findings: Secondary | ICD-10-CM | POA: Insufficient documentation

## 2023-04-26 DIAGNOSIS — Z7984 Long term (current) use of oral hypoglycemic drugs: Secondary | ICD-10-CM

## 2023-04-26 DIAGNOSIS — R7989 Other specified abnormal findings of blood chemistry: Secondary | ICD-10-CM | POA: Diagnosis not present

## 2023-04-26 DIAGNOSIS — E119 Type 2 diabetes mellitus without complications: Secondary | ICD-10-CM

## 2023-04-26 DIAGNOSIS — E538 Deficiency of other specified B group vitamins: Secondary | ICD-10-CM

## 2023-04-26 DIAGNOSIS — D649 Anemia, unspecified: Secondary | ICD-10-CM

## 2023-04-26 DIAGNOSIS — E7800 Pure hypercholesterolemia, unspecified: Secondary | ICD-10-CM

## 2023-04-26 LAB — URINALYSIS, ROUTINE W REFLEX MICROSCOPIC
Bilirubin Urine: NEGATIVE
Hgb urine dipstick: NEGATIVE
Ketones, ur: NEGATIVE
Leukocytes,Ua: NEGATIVE
Nitrite: NEGATIVE
RBC / HPF: NONE SEEN (ref 0–?)
Specific Gravity, Urine: 1.02 (ref 1.000–1.030)
Total Protein, Urine: NEGATIVE
Urine Glucose: >=1000 — AB
Urobilinogen, UA: 0.2 (ref 0.0–1.0)
pH: 6.5 (ref 5.0–8.0)

## 2023-04-26 LAB — COMPREHENSIVE METABOLIC PANEL
ALT: 32 U/L (ref 0–35)
AST: 28 U/L (ref 0–37)
Albumin: 4.4 g/dL (ref 3.5–5.2)
Alkaline Phosphatase: 81 U/L (ref 39–117)
BUN: 16 mg/dL (ref 6–23)
CO2: 29 meq/L (ref 19–32)
Calcium: 9.6 mg/dL (ref 8.4–10.5)
Chloride: 104 meq/L (ref 96–112)
Creatinine, Ser: 0.81 mg/dL (ref 0.40–1.20)
GFR: 64.9 mL/min (ref >60.00)
Glucose, Bld: 157 mg/dL — ABNORMAL HIGH (ref 70–99)
Potassium: 4 meq/L (ref 3.5–5.1)
Sodium: 139 meq/L (ref 135–145)
Total Bilirubin: 0.5 mg/dL (ref 0.2–1.2)
Total Protein: 6.9 g/dL (ref 6.0–8.3)

## 2023-04-26 LAB — CBC WITH DIFFERENTIAL/PLATELET
Basophils Absolute: 0 10*3/uL (ref 0.0–0.1)
Basophils Relative: 0.6 % (ref 0.0–3.0)
Eosinophils Absolute: 0.1 10*3/uL (ref 0.0–0.7)
Eosinophils Relative: 1.3 % (ref 0.0–5.0)
HCT: 38.9 % (ref 36.0–46.0)
Hemoglobin: 12.7 g/dL (ref 12.0–15.0)
Lymphocytes Relative: 33.4 % (ref 12.0–46.0)
Lymphs Abs: 1.9 10*3/uL (ref 0.7–4.0)
MCHC: 32.7 g/dL (ref 30.0–36.0)
MCV: 97.1 fL (ref 78.0–100.0)
Monocytes Absolute: 0.6 10*3/uL (ref 0.1–1.0)
Monocytes Relative: 10.1 % (ref 3.0–12.0)
Neutro Abs: 3.2 10*3/uL (ref 1.4–7.7)
Neutrophils Relative %: 54.6 % (ref 43.0–77.0)
Platelets: 246 10*3/uL (ref 150.0–400.0)
RBC: 4 Mil/uL (ref 3.87–5.11)
RDW: 13.2 % (ref 11.5–15.5)
WBC: 5.8 10*3/uL (ref 4.0–10.5)

## 2023-04-26 LAB — MICROALBUMIN / CREATININE URINE RATIO
Creatinine,U: 73.6 mg/dL
Microalb Creat Ratio: 1.1 mg/g (ref 0.0–30.0)
Microalb, Ur: 0.8 mg/dL (ref 0.0–1.9)

## 2023-04-26 LAB — T3, FREE: T3, Free: 3.2 pg/mL (ref 2.3–4.2)

## 2023-04-26 LAB — VITAMIN B12: Vitamin B-12: 427 pg/mL (ref 211–911)

## 2023-04-26 LAB — HEMOGLOBIN A1C: Hgb A1c MFr Bld: 9.1 % — ABNORMAL HIGH (ref 4.6–6.5)

## 2023-04-26 LAB — LDL CHOLESTEROL, DIRECT: Direct LDL: 102 mg/dL

## 2023-04-26 LAB — TSH: TSH: 0.99 u[IU]/mL (ref 0.35–5.50)

## 2023-04-26 MED ORDER — METFORMIN HCL 500 MG PO TABS: 500.0000 mg | ORAL_TABLET | Freq: Two times a day (BID) | ORAL | 0 refills | Status: DC

## 2023-04-26 MED ORDER — SIMVASTATIN 20 MG PO TABS: 20.0000 mg | ORAL_TABLET | Freq: Every day | ORAL | 1 refills | Status: DC

## 2023-04-26 NOTE — Progress Notes (Signed)
 Established Patient Office Visit   Subjective:  Patient ID: Lori Valdez, female    DOB: Oct 21, 1932  Age: 89 y.o. MRN: [id number]  Chief Complaint  Patient presents with   Medical Management of Chronic Issues    Follow up. Pt is overdue for an appointment.     HPI Encounter Diagnoses  Name Primary?   Healthcare maintenance Yes   Diabetes mellitus without complication (HCC)    Elevated LDL cholesterol level    Anemia, unspecified type    B12 deficiency    Low TSH level    H she does have ere for physical and follow-up of above.  Lost to follow-up now for a year and a half.  Has been out of her medications.  She exercises on a daily basis.  She has access to dental care.  She has never smoked.  She and her husband have 1 drink daily.  She retired as a dietitian.   Review of Systems  Constitutional: Negative.  Negative for chills, fever, malaise/fatigue and weight loss.  HENT: Negative.    Eyes:  Negative for blurred vision, discharge and redness.  Respiratory: Negative.  Negative for cough.   Cardiovascular: Negative.   Gastrointestinal:  Negative for abdominal pain, blood in stool, constipation and melena.  Genitourinary: Negative.   Musculoskeletal: Negative.  Negative for myalgias.  Skin:  Negative for rash.  Neurological:  Negative for tingling, loss of consciousness and weakness.  Endo/Heme/Allergies:  Negative for polydipsia.   Diabetic Foot Exam - Simple   Simple Foot Form Diabetic Foot exam was performed with the following findings: Yes 04/26/2023 11:28 AM  Visual Inspection No deformities, no ulcerations, no other skin breakdown bilaterally: Yes Sensation Testing See comments: Yes Pulse Check Posterior Tibialis and Dorsalis pulse intact bilaterally: Yes Comments Feet are cavus bilaterally.  There are no lesions or ulcerations.  Denies numbness or tingling in her feet.       Current Outpatient Medications:    Multiple Vitamin (MULTIVITAMIN WITH  MINERALS) TABS tablet, Take 1 tablet by mouth daily., Disp: , Rfl:    Omega-3 Fatty Acids (FISH OIL) 1000 MG CAPS, Take 1,000 mg by mouth daily. , Disp: , Rfl:    doxycycline  (VIBRAMYCIN ) 100 MG capsule, Take 1 capsule (100 mg total) by mouth 2 (two) times daily. (Patient not taking: Reported on 04/26/2023), Disp: 20 capsule, Rfl: 0   metFORMIN  (GLUCOPHAGE ) 500 MG tablet, Take 1 tablet (500 mg total) by mouth 2 (two) times daily with a meal., Disp: 180 tablet, Rfl: 0   simvastatin  (ZOCOR ) 20 MG tablet, Take 1 tablet (20 mg total) by mouth at bedtime., Disp: 90 tablet, Rfl: 1   Objective:     BP 130/68 (Cuff Size: Small)   Pulse 68   Temp 98.1 F (36.7 C)   Ht 5' 3 (1.6 m)   Wt 118 lb 3.2 oz (53.6 kg)   SpO2 97%   BMI 20.94 kg/m  Wt Readings from Last 3 Encounters:  04/26/23 118 lb 3.2 oz (53.6 kg)  08/04/21 112 lb 6.4 oz (51 kg)  01/29/19 112 lb (50.8 kg)      Physical Exam Constitutional:      General: She is not in acute distress.    Appearance: Normal appearance. She is not ill-appearing, toxic-appearing or diaphoretic.  HENT:     Head: Normocephalic and atraumatic.     Right Ear: Tympanic membrane, ear canal and external ear normal.     Left Ear: Tympanic membrane,  ear canal and external ear normal.     Mouth/Throat:     Mouth: Mucous membranes are moist.     Pharynx: Oropharynx is clear. No oropharyngeal exudate or posterior oropharyngeal erythema.  Eyes:     General: No scleral icterus.       Right eye: No discharge.        Left eye: No discharge.     Extraocular Movements: Extraocular movements intact.     Conjunctiva/sclera: Conjunctivae normal.     Pupils: Pupils are equal, round, and reactive to light.  Cardiovascular:     Rate and Rhythm: Normal rate and regular rhythm.     Pulses:          Dorsalis pedis pulses are 2+ on the right side and 2+ on the left side.       Posterior tibial pulses are 2+ on the right side and 2+ on the left side.  Pulmonary:      Effort: Pulmonary effort is normal. No respiratory distress.     Breath sounds: Normal breath sounds.  Abdominal:     General: Bowel sounds are normal.     Tenderness: There is no abdominal tenderness. There is no guarding.  Musculoskeletal:     Cervical back: No rigidity or tenderness.  Skin:    General: Skin is warm and dry.  Neurological:     Mental Status: She is alert and oriented to person, place, and time.  Psychiatric:        Mood and Affect: Mood normal.        Behavior: Behavior normal.      No results found for any visits on 04/26/23.    The ASCVD Risk score (Arnett DK, et al., 2019) failed to calculate for the following reasons:   The 2019 ASCVD risk score is only valid for ages 14 to 17    Assessment & Plan:   Healthcare maintenance -     Urinalysis, Routine w reflex microscopic  Diabetes mellitus without complication (HCC) -     metFORMIN  HCl; Take 1 tablet (500 mg total) by mouth 2 (two) times daily with a meal.  Dispense: 180 tablet; Refill: 0 -     Comprehensive metabolic panel -     Hemoglobin A1c -     Microalbumin / creatinine urine ratio  Elevated LDL cholesterol level -     Simvastatin ; Take 1 tablet (20 mg total) by mouth at bedtime.  Dispense: 90 tablet; Refill: 1 -     Comprehensive metabolic panel -     LDL cholesterol, direct  Anemia, unspecified type -     CBC with Differential/Platelet -     TSH  B12 deficiency -     Vitamin B12 -     CBC with Differential/Platelet  Low TSH level -     TSH -     T3, free    Return in about 3 months (around 07/25/2023).    Elsie Sim Lent, MD

## 2023-07-11 ENCOUNTER — Other Ambulatory Visit: Payer: Self-pay | Admitting: Family Medicine

## 2023-07-11 DIAGNOSIS — E119 Type 2 diabetes mellitus without complications: Secondary | ICD-10-CM

## 2023-08-01 ENCOUNTER — Encounter: Payer: Self-pay | Admitting: Emergency Medicine

## 2023-08-01 ENCOUNTER — Ambulatory Visit
Admission: EM | Admit: 2023-08-01 | Discharge: 2023-08-01 | Disposition: A | Discharge status: Home / Self Care | Attending: Internal Medicine | Admitting: Internal Medicine

## 2023-08-01 DIAGNOSIS — S0502XA Injury of conjunctiva and corneal abrasion without foreign body, left eye, initial encounter: Secondary | ICD-10-CM | POA: Diagnosis not present

## 2023-08-01 MED ORDER — ERYTHROMYCIN 5 MG/GM OP OINT: TOPICAL_OINTMENT | OPHTHALMIC | 0 refills | Status: AC

## 2023-08-01 NOTE — Discharge Instructions (Signed)
 Your eye discomfort is due to corneal irritation/abrasion.  This is a scratch to the protective film of your eye.  Use warm compresses frequently with clean wash cloth. Apply thin ribbon of erythromycin eye ointment to the inside of the lower eyelid twice daily. After applying ointment, close eyes and roll eyes around to coat the eye. This may result in blurry vision to the affected eye for 30-60 minutes, so plan accordingly if you need to drive after applying ointment.  Apply ointment twice a day for 7 days.  Follow-up with PCP or eye doctor.  If you develop any new or worsening symptoms or if your symptoms do not start to improve, pleases return here or follow-up with your primary care provider. If your symptoms are severe, please go to the emergency room.

## 2023-08-01 NOTE — ED Triage Notes (Signed)
 Pt presents with injury to left eye. States last night while sleeping she hit eye with thumb and this morning eye is very red. Denies any pain or vision changes.

## 2023-08-01 NOTE — ED Provider Notes (Signed)
 [facility]    CSN: [id number] Arrival date & time: 08/01/23  4098      History   Chief Complaint Chief Complaint  Patient presents with   Eye Problem    HPI Lori Valdez is a 89 y.o. female.   Patient presents to urgent care for evaluation of left eye redness, swelling, and watery drainage that started upon waking this morning. She believes she remembers scratching the left eye last night overnight with her thumb, no other recent trauma/injuries to the eye. Denies blurry vision, stars in vision, curtain sensation to vision, and other vision changes.  Denies dizziness, nausea, vomiting, crusty eye drainage, pain to the left eye, and fever.  Denies light sensitivity to the eyes.  No headache or recent falls.  She does not take blood thinners.  Denies use of glasses or contact lenses for vision correction.  She has not attempted use of any over-the-counter medications to help with symptoms prior to arrival.   Eye Problem   History reviewed. No pertinent past medical history.  Patient Active Problem List   Diagnosis Date Noted   Low TSH level 04/26/2023   Healthcare maintenance 04/26/2023   Elevated alkaline phosphatase level 12/28/2018   Anemia 12/28/2018   Estrogen deficiency 12/28/2018   B12 deficiency 12/28/2018   Hyperthyroidism 12/28/2018   History of fever 12/19/2018   Elevated LDL cholesterol level 12/20/2017   Diabetes mellitus without complication (HCC) 12/20/2017    History reviewed. No pertinent surgical history.  OB History   No obstetric history on file.      Home Medications    Prior to Admission medications   Medication Sig Start Date End Date Taking? Authorizing Provider  erythromycin  ophthalmic ointment Place a 1/2 inch ribbon of ointment into the lower eyelid of the left eye every 12 hours for the next 7 days. 08/01/23  Yes Starlene Eaton, FNP  doxycycline  (VIBRAMYCIN ) 100 MG capsule Take 1 capsule (100 mg total) by mouth 2  (two) times daily. Patient not taking: Reported on 04/26/2023 09/27/22   Murrill, Samantha, FNP  metFORMIN  (GLUCOPHAGE ) 500 MG tablet TAKE 1 TABLET BY MOUTH TWICE  DAILY WITH MEALS 07/11/23   Tonna Frederic, MD  Multiple Vitamin (MULTIVITAMIN WITH MINERALS) TABS tablet Take 1 tablet by mouth daily.    [provider]  Omega-3 Fatty Acids (FISH OIL) 1000 MG CAPS Take 1,000 mg by mouth daily.     [provider]  simvastatin  (ZOCOR ) 20 MG tablet Take 1 tablet (20 mg total) by mouth at bedtime. 04/26/23   Tonna Frederic, MD    Family History Family History  Problem Relation Age of Onset   Healthy Mother    Healthy Father    Breast cancer Neg Hx     Social History Social History   Tobacco Use   Smoking status: Never   Smokeless tobacco: Never  Vaping Use   Vaping status: Never Used  Substance Use Topics   Alcohol use: Yes    Comment: drinks 2 scotch and waters nightly   Drug use: Never     Allergies   Patient has no known allergies.   Review of Systems Review of Systems Per HPI  Physical Exam Triage Vital Signs ED Triage Vitals  Encounter Vitals Group     BP 08/01/23 1010 (!) 162/75     Systolic BP Percentile --      Diastolic BP Percentile --      Pulse Rate 08/01/23 1010 70  Resp 08/01/23 1010 17     Temp 08/01/23 1010 (!) 97.4 F (36.3 C)     Temp Source 08/01/23 1010 Oral     SpO2 08/01/23 1010 96 %     Weight --      Height --      Head Circumference --      Peak Flow --      Pain Score 08/01/23 1013 0     Pain Loc --      Pain Education --      Exclude from Growth Chart --    No data found.  Updated Vital Signs BP (!) 162/75 (BP Location: Right Arm)   Pulse 70   Temp (!) 97.4 F (36.3 C) (Oral)   Resp 17   SpO2 96%   Visual Acuity Right Eye Distance:   Left Eye Distance:   Bilateral Distance:    Right Eye Near:   Left Eye Near:    Bilateral Near:     Physical Exam Vitals and nursing note reviewed.   Constitutional:      Appearance: She is not ill-appearing or toxic-appearing.  HENT:     Head: Normocephalic and atraumatic.     Right Ear: Hearing and external ear normal.     Left Ear: Hearing and external ear normal.     Nose: Nose normal.     Mouth/Throat:     Lips: Pink.  Eyes:     General: Lids are normal. Lids are everted, no foreign bodies appreciated. Vision grossly intact. Gaze aligned appropriately.        Right eye: No foreign body, discharge or hordeolum.        Left eye: Discharge (Watery drainage to the left eye) present.No foreign body or hordeolum.     Extraocular Movements: Extraocular movements intact.     Conjunctiva/sclera:     Right eye: Right conjunctiva is not injected. No chemosis, exudate or hemorrhage.    Left eye: Left conjunctiva is injected. Hemorrhage present. No chemosis or exudate.    Pupils: Pupils are equal, round, and reactive to light.     Left eye: Corneal abrasion and fluorescein  uptake present. Seidel exam negative.    Slit lamp exam:    Left eye: No corneal ulcer, foreign body, hyphema or photophobia.      Comments: EOMs intact without pain or dizziness elicited. Fluorescein  stain completed, patient tolerated this well.  Linear corneal abrasion present to the left eye, see graphic above.   Pulmonary:     Effort: Pulmonary effort is normal.  Musculoskeletal:     Cervical back: Neck supple.  Skin:    General: Skin is warm and dry.     Capillary Refill: Capillary refill takes less than 2 seconds.     Findings: No rash.  Neurological:     General: No focal deficit present.     Mental Status: She is alert and oriented to person, place, and time. Mental status is at baseline.     Cranial Nerves: No dysarthria or facial asymmetry.  Psychiatric:        Mood and Affect: Mood normal.        Speech: Speech normal.        Behavior: Behavior normal.        Thought Content: Thought content normal.        Judgment: Judgment normal.      UC  Treatments / Results  Labs (all labs ordered are listed, but only abnormal results are  displayed) Labs Reviewed - No data to display  EKG   Radiology No results found.  Procedures Procedures (including critical care time)  Medications Ordered in UC Medications - No data to display  Initial Impression / Assessment and Plan / UC Course  I have reviewed the triage vital signs and the nursing notes.  Pertinent labs & imaging results that were available during my care of the patient were reviewed by me and considered in my medical decision making (see chart for details).   1.  Abrasion of left cornea Left corneal abrasion on exam. Will treat with erythromycin  eye, no allergies to antibiotics. Warm compresses encouraged prior to erythromycin  application. No red flags on exam. Vision is intact.  Low suspicion for globe rupture/ocular emergency, therefore deferred referral to the emergency room. Recommend follow-up with PCP in the next 3 to 4 days for reevaluation as well as ophthalmologist.  Counseled patient on potential for adverse effects with medications prescribed/recommended today, strict ER and return-to-clinic precautions discussed, patient verbalized understanding.    Final Clinical Impressions(s) / UC Diagnoses   Final diagnoses:  Abrasion of left cornea, initial encounter     Discharge Instructions      Your eye discomfort is due to corneal irritation/abrasion.  This is a scratch to the protective film of your eye.  Use warm compresses frequently with clean wash cloth. Apply thin ribbon of erythromycin  eye ointment to the inside of the lower eyelid twice daily. After applying ointment, close eyes and roll eyes around to coat the eye. This may result in blurry vision to the affected eye for 30-60 minutes, so plan accordingly if you need to drive after applying ointment.  Apply ointment twice a day for 7 days.  Follow-up with PCP or eye doctor.  If you develop  any new or worsening symptoms or if your symptoms do not start to improve, pleases return here or follow-up with your primary care provider. If your symptoms are severe, please go to the emergency room.     ED Prescriptions     Medication Sig Dispense Auth. Provider   erythromycin  ophthalmic ointment Place a 1/2 inch ribbon of ointment into the lower eyelid of the left eye every 12 hours for the next 7 days. 3.5 g Starlene Eaton, FNP      PDMP not reviewed this encounter.   Starlene Eaton, [place] 08/01/23 [id number]

## 2023-08-02 ENCOUNTER — Encounter (HOSPITAL_COMMUNITY): Payer: Self-pay

## 2023-08-02 ENCOUNTER — Other Ambulatory Visit: Payer: Self-pay

## 2023-08-02 ENCOUNTER — Emergency Department (HOSPITAL_COMMUNITY)
Admission: EM | Admit: 2023-08-02 | Discharge: 2023-08-02 | Disposition: A | Discharge status: Home / Self Care | Attending: Emergency Medicine | Admitting: Emergency Medicine

## 2023-08-02 DIAGNOSIS — H1132 Conjunctival hemorrhage, left eye: Secondary | ICD-10-CM | POA: Diagnosis not present

## 2023-08-02 DIAGNOSIS — H5789 Other specified disorders of eye and adnexa: Secondary | ICD-10-CM | POA: Diagnosis present

## 2023-08-02 MED ORDER — FLUORESCEIN SODIUM 1 MG OP STRP
3.0000 | ORAL_STRIP | Freq: Once | OPHTHALMIC | Status: AC
  Administered 2023-08-02: 3 via OPHTHALMIC
  Filled 2023-08-02: qty 3

## 2023-08-02 MED ORDER — TETRACAINE HCL 0.5 % OP SOLN
2.0000 [drp] | Freq: Once | OPHTHALMIC | Status: AC
  Administered 2023-08-02: 2 [drp] via OPHTHALMIC
  Filled 2023-08-02: qty 4

## 2023-08-02 NOTE — ED Provider Notes (Signed)
 [facility] AT [facility] Provider Note   CSN: [id number] Arrival date & time: 08/02/23  1303     History  Chief Complaint  Patient presents with   Eye Problem    Lori Valdez is a 89 y.o. female.  89 year old female presents with her husband today for concern of worsening eye redness in the left eye.  She was seen at the urgent care yesterday and prescribed erythromycin  ophthalmic ointment.  She states today she noticed her eye redness to be worse.  She states this initially occurred when she poked herself in the eye while she was sleeping.  Denies any pain, or vision change.  No other complaints.  The history is provided by the patient. No language interpreter was used.       Home Medications Prior to Admission medications   Medication Sig Start Date End Date Taking? Authorizing Provider  doxycycline  (VIBRAMYCIN ) 100 MG capsule Take 1 capsule (100 mg total) by mouth 2 (two) times daily. Patient not taking: Reported on 04/26/2023 09/27/22   Maryruth Sol, FNP  erythromycin  ophthalmic ointment Place a 1/2 inch ribbon of ointment into the lower eyelid of the left eye every 12 hours for the next 7 days. 08/01/23   Starlene Eaton, FNP  metFORMIN  (GLUCOPHAGE ) 500 MG tablet TAKE 1 TABLET BY MOUTH TWICE  DAILY WITH MEALS 07/11/23   Tonna Frederic, MD  Multiple Vitamin (MULTIVITAMIN WITH MINERALS) TABS tablet Take 1 tablet by mouth daily.    [provider]  Omega-3 Fatty Acids (FISH OIL) 1000 MG CAPS Take 1,000 mg by mouth daily.     [provider]  simvastatin  (ZOCOR ) 20 MG tablet Take 1 tablet (20 mg total) by mouth at bedtime. 04/26/23   Tonna Frederic, MD      Allergies    Patient has no known allergies.    Review of Systems   Review of Systems  Constitutional:  Negative for chills and fever.  Eyes:  Positive for redness. Negative for photophobia, pain, discharge, itching and visual disturbance.   All other systems reviewed and are negative.   Physical Exam Updated Vital Signs BP (!) 143/68   Pulse 66   Temp 97.8 F (36.6 C) (Oral)   Resp 17   Ht 5\' 3"  (1.6 m)   Wt 53.1 kg   SpO2 100%   BMI 20.73 kg/m  Physical Exam Vitals and nursing note reviewed.  Constitutional:      General: She is not in acute distress.    Appearance: Normal appearance. She is not ill-appearing.  HENT:     Head: Normocephalic and atraumatic.     Nose: Nose normal.  Eyes:     General: Lids are normal.     Conjunctiva/sclera:     Right eye: No hemorrhage.    Left eye: Hemorrhage present.  Pulmonary:     Effort: Pulmonary effort is normal. No respiratory distress.  Musculoskeletal:        General: No deformity.  Skin:    Findings: No rash.  Neurological:     Mental Status: She is alert.     ED Results / Procedures / Treatments   Labs (all labs ordered are listed, but only abnormal results are displayed) Labs Reviewed - No data to display  EKG None  Radiology No results found.  Procedures Procedures    Medications Ordered in ED Medications  tetracaine  (PONTOCAINE) 0.5 % ophthalmic solution 2 drop (has no administration in time  range)  fluorescein  ophthalmic strip 3 strip (has no administration in time range)    ED Course/ Medical Decision Making/ A&P                                 Medical Decision Making Risk Prescription drug management.   89 year old female presents today for concern of worsening redness to the left eye.  Consistent with subconjunctival hemorrhage.  Visual acuity intact.  This is painless.  Fluorescein  uptake noted.  She is already on erythromycin  eye ointment.  Pressures are within normal.  14 on the left, 16 on the right. Ophthalmology referral given in case this does not improve. Discharged in stable condition.  Return precaution discussed.  Discussed with attending.   Final Clinical Impression(s) / ED Diagnoses Final diagnoses:   Subconjunctival hemorrhage of left eye    Rx / DC Orders ED Discharge Orders     None         Lucina Sabal, PA-C 08/02/23 1732    Arvilla Birmingham, MD 08/02/23 2358

## 2023-08-02 NOTE — ED Triage Notes (Signed)
 Left eye has blood in sclera after poking herself in the eye yesterday, saw UC and prescribed erythromycin . Blood is worse today than yesterday. No vision changes.

## 2023-08-02 NOTE — Discharge Instructions (Addendum)
 You likely had subconjunctival hemorrhage.  This is a benign condition and resolves on its own.  However if this persist and does not get better I have given you ophthalmology referral listed above.  Return for any emergent symptoms.  Continue using the erythromycin  ophthalmic ointment.

## 2023-08-14 ENCOUNTER — Other Ambulatory Visit: Payer: Self-pay | Admitting: Family Medicine

## 2023-08-14 DIAGNOSIS — E7800 Pure hypercholesterolemia, unspecified: Secondary | ICD-10-CM

## 2023-09-04 ENCOUNTER — Ambulatory Visit
Admission: RE | Admit: 2023-09-04 | Discharge: 2023-09-04 | Discharge status: Home / Self Care | Payer: Self-pay | Source: Ambulatory Visit | Attending: Internal Medicine

## 2023-09-04 VITALS — BP 129/72 | HR 69 | Temp 97.3°F | Resp 16

## 2023-09-04 DIAGNOSIS — E11628 Type 2 diabetes mellitus with other skin complications: Secondary | ICD-10-CM

## 2023-09-04 DIAGNOSIS — Z23 Encounter for immunization: Secondary | ICD-10-CM

## 2023-09-04 DIAGNOSIS — S41111A Laceration without foreign body of right upper arm, initial encounter: Secondary | ICD-10-CM

## 2023-09-04 MED ORDER — TETANUS-DIPHTH-ACELL PERTUSSIS 5-2.5-18.5 LF-MCG/0.5 IM SUSY
0.5000 mL | PREFILLED_SYRINGE | Freq: Once | INTRAMUSCULAR | Status: AC
  Administered 2023-09-04: 0.5 mL via INTRAMUSCULAR

## 2023-09-04 MED ORDER — MUPIROCIN 2 % EX OINT: 1.0000 | TOPICAL_OINTMENT | Freq: Two times a day (BID) | CUTANEOUS | 0 refills | Status: AC

## 2023-09-04 NOTE — Discharge Instructions (Signed)
 Wound care: Please keep the area surrounding the wound/sutures clean and dry for the next 24 hours. After 24 hours, you may get the wound wet. Gently clean wound with antibacterial soap. Do not scrub wound.   Apply a very thin layer of mupirocin  ointment to the wound every 12 hours for the next 7 days.  You may keep the wound open to air and cover with non-stick bandage and coban sticky wrap as needed if you will be in a dirty environment.   You should have the sutures removed in 7 days by your primary care provider or at urgent care. Return sooner than 7 days if you experience discharge from your laceration, redness around your laceration, warmth around your laceration, or fever.   You may take over the counter medicines as needed for aches and pains once the numbing wears off.   Thanks for letting me fix your cut today!

## 2023-09-04 NOTE — ED Provider Notes (Signed)
 [name]    CSN: [id number] Arrival date & time: 09/04/23  1045      History   Chief Complaint Chief Complaint  Patient presents with   Laceration    Entered by patient    HPI SEMA STANGLER is a 89 y.o. female.   Patient presents to urgent care for evaluation of skin tear to the right upper arm that happened as a result of a fall last night at approximately 5 PM (17 to 18 hours ago).  She tripped on one of her legs while she was turning to the right causing her to trip and fall onto the right arm.  She landed onto her right elbow and this caused a skin tear to the superior right lateral elbow.  She did not hit her head during the fall and she denies loss of consciousness, headache, dizziness, nausea, vomiting, and visual disturbance after falling.  No preceding dizziness, chest pain, shortness of breath, etc.  She does not take blood thinners.  Bleeding to wound controlled with pressure last night at home.  She was able to rinse the wound thoroughly last night.  She does have type 2 diabetes and is at increased risk for delayed wound healing.  Denies recent antibiotic or steroid use.  Last tetanus injection was greater than 5 years ago.     No past medical history on file.  Patient Active Problem List   Diagnosis Date Noted   Low TSH level 04/26/2023   Healthcare maintenance 04/26/2023   Elevated alkaline phosphatase level 12/28/2018   Anemia 12/28/2018   Estrogen deficiency 12/28/2018   B12 deficiency 12/28/2018   Hyperthyroidism 12/28/2018   History of fever 12/19/2018   Elevated LDL cholesterol level 12/20/2017   Diabetes mellitus without complication (HCC) 12/20/2017    No past surgical history on file.  OB History   No obstetric history on file.      Home Medications    Prior to Admission medications   Medication Sig Start Date End Date Taking? Authorizing Provider  mupirocin ointment (BACTROBAN) 2 % Apply 1 Application topically 2  (two) times daily. 09/04/23  Yes Starlene Eaton, FNP  doxycycline  (VIBRAMYCIN ) 100 MG capsule Take 1 capsule (100 mg total) by mouth 2 (two) times daily. Patient not taking: Reported on 04/26/2023 09/27/22   Maryruth Sol, FNP  erythromycin  ophthalmic ointment Place a 1/2 inch ribbon of ointment into the lower eyelid of the left eye every 12 hours for the next 7 days. 08/01/23   Starlene Eaton, FNP  metFORMIN  (GLUCOPHAGE ) 500 MG tablet TAKE 1 TABLET BY MOUTH TWICE  DAILY WITH MEALS 07/11/23   Tonna Frederic, MD  Multiple Vitamin (MULTIVITAMIN WITH MINERALS) TABS tablet Take 1 tablet by mouth daily.    [provider]  Omega-3 Fatty Acids (FISH OIL) 1000 MG CAPS Take 1,000 mg by mouth daily.     [provider]  simvastatin  (ZOCOR ) 20 MG tablet TAKE 1 TABLET BY MOUTH AT  BEDTIME 08/15/23   Tonna Frederic, MD    Family History Family History  Problem Relation Age of Onset   Healthy Mother    Healthy Father    Breast cancer Neg Hx     Social History Social History   Tobacco Use   Smoking status: Never   Smokeless tobacco: Never  Vaping Use   Vaping status: Never Used  Substance Use Topics   Alcohol use: Yes    Comment: drinks 2 scotch  and waters nightly   Drug use: Never     Allergies   Patient has no known allergies.   Review of Systems Review of Systems Per HPI  Physical Exam Triage Vital Signs ED Triage Vitals  Encounter Vitals Group     BP 09/04/23 1052 129/72     Systolic BP Percentile --      Diastolic BP Percentile --      Pulse Rate 09/04/23 1052 69     Resp 09/04/23 1052 16     Temp 09/04/23 1052 (!) 97.3 F (36.3 C)     Temp Source 09/04/23 1052 Oral     SpO2 09/04/23 1052 95 %     Weight --      Height --      Head Circumference --      Peak Flow --      Pain Score 09/04/23 1055 0     Pain Loc --      Pain Education --      Exclude from Growth Chart --    No data found.  Updated Vital Signs BP  129/72 (BP Location: Right Arm)   Pulse 69   Temp (!) 97.3 F (36.3 C) (Oral)   Resp 16   SpO2 95%   Visual Acuity Right Eye Distance:   Left Eye Distance:   Bilateral Distance:    Right Eye Near:   Left Eye Near:    Bilateral Near:     Physical Exam Vitals and nursing note reviewed.  Constitutional:      Appearance: She is not ill-appearing or toxic-appearing.  HENT:     Head: Normocephalic and atraumatic.     Right Ear: Hearing and external ear normal.     Left Ear: Hearing and external ear normal.     Nose: Nose normal.     Mouth/Throat:     Lips: Pink.  Eyes:     General: Lids are normal. Vision grossly intact. Gaze aligned appropriately.     Extraocular Movements: Extraocular movements intact.     Conjunctiva/sclera: Conjunctivae normal.  Pulmonary:     Effort: Pulmonary effort is normal.  Musculoskeletal:     Right shoulder: Normal.     Left shoulder: Normal.     Right upper arm: Laceration present. No swelling, edema, deformity, tenderness or bony tenderness.     Left upper arm: Normal.     Right elbow: Normal.     Left elbow: Normal.     Right forearm: Normal.     Left forearm: Normal.     Cervical back: Neck supple.  Skin:    General: Skin is warm and dry.     Capillary Refill: Capillary refill takes less than 2 seconds.     Findings: Laceration (4cm superficial laceration/skin tear to the right upper arm as seen in image below superficial to the right elbow) present. No rash.  Neurological:     General: No focal deficit present.     Mental Status: She is alert and oriented to person, place, and time. Mental status is at baseline.     GCS: GCS eye subscore is 4. GCS verbal subscore is 5. GCS motor subscore is 6.     Cranial Nerves: Cranial nerves 2-12 are intact. No dysarthria or facial asymmetry.     Sensory: Sensation is intact.     Motor: Motor function is intact. No weakness, tremor, abnormal muscle tone or pronator drift.     Coordination:  Coordination is intact.  Romberg sign negative. Coordination normal. Finger-Nose-Finger Test normal.     Gait: Gait is intact.     Comments: Strength and sensation intact to bilateral upper and lower extremities (5/5). Moves all 4 extremities with normal coordination voluntarily. Non-focal neuro exam.   Psychiatric:        Mood and Affect: Mood normal.        Speech: Speech normal.        Behavior: Behavior normal.        Thought Content: Thought content normal.        Judgment: Judgment normal.      UC Treatments / Results  Labs (all labs ordered are listed, but only abnormal results are displayed) Labs Reviewed - No data to display  EKG   Radiology No results found.  Procedures Laceration Repair  Date/Time: 09/04/2023 11:37 AM  Performed by: Starlene Eaton, FNP Authorized by: Starlene Eaton, FNP   Consent:    Consent obtained:  Verbal   Consent given by:  Patient and spouse   Risks, benefits, and alternatives were discussed: yes     Risks discussed:  Infection, need for additional repair, nerve damage, pain, poor cosmetic result, poor wound healing, retained foreign body, tendon damage and vascular damage   Alternatives discussed: Steri-strips, these will likely not hold very well and increase risk of infection and delayed wound healing. Universal protocol:    Patient identity confirmed:  Verbally with patient Anesthesia:    Anesthesia method:  Local infiltration   Local anesthetic:  Lidocaine 1% w/o epi Laceration details:    Location:  Shoulder/arm   Shoulder/arm location:  R upper arm   Length (cm):  4   Depth (mm):  3 Exploration:    Wound exploration: wound explored through full range of motion and entire depth of wound visualized   Treatment:    Area cleansed with:  Povidone-iodine and saline   Amount of cleaning:  Standard Skin repair:    Repair method:  Sutures   Suture size:  5-0   Suture material:  Prolene   Suture technique:  Simple  interrupted   Number of sutures:  3 Approximation:    Approximation:  Close Repair type:    Repair type:  Simple Post-procedure details:    Dressing:  Antibiotic ointment and non-adherent dressing   Procedure completion:  Tolerated well, no immediate complications  (including critical care time)  Medications Ordered in UC Medications  Tdap (BOOSTRIX) injection 0.5 mL (0.5 mLs Intramuscular Given 09/04/23 1123)    Initial Impression / Assessment and Plan / UC Course  I have reviewed the triage vital signs and the nursing notes.  Pertinent labs & imaging results that were available during my care of the patient were reviewed by me and considered in my medical decision making (see chart for details).   1. Skin tear of right upper arm without complication, need for tetanus booster, type 2 diabetes with skin complication Laceration repaired, see procedure note above for details. Discussed wound care and cleaning at home.  She is at risk for delayed wound healing and infection due to age and diabetes, therefore topical mupirocin BID for 7 days ordered.  Imaging: deferred given stable MSK exam and low suspicion for acute bony abnormality.  Infection return precautions discussed.  Suture removal in 7 days.  Tdap updated today.  Tylenol as needed for pain at home.  Advised to rest and avoid activities that may increase tension to wound/sutures or expose wound to infection.  Excuse note given.   Counseled patient on potential for adverse effects with medications prescribed/recommended today, strict ER and return-to-clinic precautions discussed, patient verbalized understanding.    Final Clinical Impressions(s) / UC Diagnoses   Final diagnoses:  Skin tear of right upper arm without complication, initial encounter  Need for tetanus booster     Discharge Instructions      Wound care: Please keep the area surrounding the wound/sutures clean and dry for the next 24 hours. After 24 hours,  you may get the wound wet. Gently clean wound with antibacterial soap. Do not scrub wound.   Apply a very thin layer of mupirocin ointment to the wound every 12 hours for the next 7 days.  You may keep the wound open to air and cover with non-stick bandage and coban sticky wrap as needed if you will be in a dirty environment.   You should have the sutures removed in 7 days by your primary care provider or at urgent care. Return sooner than 7 days if you experience discharge from your laceration, redness around your laceration, warmth around your laceration, or fever.   You may take over the counter medicines as needed for aches and pains once the numbing wears off.   Thanks for letting me fix your cut today!   ED Prescriptions     Medication Sig Dispense Auth. Provider   mupirocin ointment (BACTROBAN) 2 % Apply 1 Application topically 2 (two) times daily. 22 g Starlene Eaton, FNP      PDMP not reviewed this encounter.   Starlene Eaton, [place] 09/04/23 1141

## 2023-09-04 NOTE — ED Triage Notes (Addendum)
 Pt presents after she fell inside yesterday. She has abrasion on right elbow.Pt states it is not painful at all

## 2023-09-12 ENCOUNTER — Ambulatory Visit
Admission: RE | Admit: 2023-09-12 | Discharge: 2023-09-12 | Disposition: A | Discharge status: Home / Self Care | Payer: Self-pay | Source: Ambulatory Visit | Attending: Emergency Medicine | Admitting: Emergency Medicine

## 2023-09-12 VITALS — BP 106/70 | HR 72 | Temp 97.9°F | Resp 16

## 2023-09-12 DIAGNOSIS — Z4802 Encounter for removal of sutures: Secondary | ICD-10-CM

## 2023-09-12 DIAGNOSIS — Z5189 Encounter for other specified aftercare: Secondary | ICD-10-CM | POA: Diagnosis not present

## 2023-09-12 MED ORDER — CEPHALEXIN 500 MG PO CAPS: 500.0000 mg | ORAL_CAPSULE | Freq: Two times a day (BID) | ORAL | 0 refills | Status: AC

## 2023-09-12 NOTE — Discharge Instructions (Signed)
 Take the Keflex twice daily with food for the next 7 days and continue to apply the topical mupirocin  ointment to help prevent wound infection.  If the area starts to drain, you develop fever, or any new concerning symptoms please return to clinic and we will reevaluate the wound site.

## 2023-09-12 NOTE — ED Triage Notes (Signed)
 Pt presents to have 3 stitches removed from right arm. Denies any complications. Pt does have area of redness around site but denies pain. Will have provider assess site.

## 2023-09-12 NOTE — ED Provider Notes (Signed)
 [facility]    CSN: [id number] Arrival date & time: 09/12/23  1152      History   Chief Complaint Chief Complaint  Patient presents with   Arm Injury    remove stiches - Entered by patient    HPI Lori Valdez is a 89 y.o. female.   Patient presents to clinic for suture removal.  Reports she has banged her arms a few more times but has not had any issues with the wound itself.  Has been using topical mupirocin  cream as prescribed.  Has not had any pain.  The area around the site is red.   The history is provided by the patient and medical records.  Arm Injury   No past medical history on file.  Patient Active Problem List   Diagnosis Date Noted   Low TSH level 04/26/2023   Healthcare maintenance 04/26/2023   Elevated alkaline phosphatase level 12/28/2018   Anemia 12/28/2018   Estrogen deficiency 12/28/2018   B12 deficiency 12/28/2018   Hyperthyroidism 12/28/2018   History of fever 12/19/2018   Elevated LDL cholesterol level 12/20/2017   Diabetes mellitus without complication (HCC) 12/20/2017    No past surgical history on file.  OB History   No obstetric history on file.      Home Medications    Prior to Admission medications   Medication Sig Start Date End Date Taking? Authorizing Provider  cephALEXin (KEFLEX) 500 MG capsule Take 1 capsule (500 mg total) by mouth 2 (two) times daily for 7 days. 09/12/23 09/19/23 Yes Sahar Ryback  N, FNP  doxycycline  (VIBRAMYCIN ) 100 MG capsule Take 1 capsule (100 mg total) by mouth 2 (two) times daily. Patient not taking: Reported on 04/26/2023 09/27/22   Maryruth Sol, FNP  erythromycin  ophthalmic ointment Place a 1/2 inch ribbon of ointment into the lower eyelid of the left eye every 12 hours for the next 7 days. 08/01/23   Starlene Eaton, FNP  metFORMIN  (GLUCOPHAGE ) 500 MG tablet TAKE 1 TABLET BY MOUTH TWICE  DAILY WITH MEALS 07/11/23   Tonna Frederic, MD  Multiple Vitamin (MULTIVITAMIN WITH  MINERALS) TABS tablet Take 1 tablet by mouth daily.    [provider]  mupirocin  ointment (BACTROBAN ) 2 % Apply 1 Application topically 2 (two) times daily. 09/04/23   Starlene Eaton, FNP  Omega-3 Fatty Acids (FISH OIL) 1000 MG CAPS Take 1,000 mg by mouth daily.     [provider]  simvastatin  (ZOCOR ) 20 MG tablet TAKE 1 TABLET BY MOUTH AT  BEDTIME 08/15/23   Tonna Frederic, MD    Family History Family History  Problem Relation Age of Onset   Healthy Mother    Healthy Father    Breast cancer Neg Hx     Social History Social History   Tobacco Use   Smoking status: Never   Smokeless tobacco: Never  Vaping Use   Vaping status: Never Used  Substance Use Topics   Alcohol use: Yes    Comment: drinks 2 scotch and waters nightly   Drug use: Never     Allergies   Patient has no known allergies.   Review of Systems Review of Systems  Per HPI  Physical Exam Triage Vital Signs ED Triage Vitals [09/12/23 1208]  Encounter Vitals Group     BP      Systolic BP Percentile      Diastolic BP Percentile      Pulse      Resp  Temp      Temp src      SpO2      Weight      Height      Head Circumference      Peak Flow      Pain Score 0     Pain Loc      Pain Education      Exclude from Growth Chart    No data found.  Updated Vital Signs BP 106/70 (BP Location: Right Arm)   Pulse 72   Temp 97.9 F (36.6 C) (Oral)   Resp 16   SpO2 95%   Visual Acuity Right Eye Distance:   Left Eye Distance:   Bilateral Distance:    Right Eye Near:   Left Eye Near:    Bilateral Near:     Physical Exam Vitals and nursing note reviewed.  Constitutional:      Appearance: Normal appearance.  HENT:     Head: Normocephalic and atraumatic.     Right Ear: External ear normal.     Left Ear: External ear normal.     Nose: Nose normal.     Mouth/Throat:     Mouth: Mucous membranes are moist.  Eyes:     Conjunctiva/sclera: Conjunctivae normal.   Cardiovascular:     Rate and Rhythm: Normal rate.  Pulmonary:     Effort: Pulmonary effort is normal. No respiratory distress.  Skin:    General: Skin is warm and dry.       Neurological:     General: No focal deficit present.     Mental Status: She is alert.  Psychiatric:        Mood and Affect: Mood normal.        Behavior: Behavior is cooperative.      UC Treatments / Results  Labs (all labs ordered are listed, but only abnormal results are displayed) Labs Reviewed - No data to display  EKG   Radiology No results found.  Procedures Procedures (including critical care time)  Medications Ordered in UC Medications - No data to display  Initial Impression / Assessment and Plan / UC Course  I have reviewed the triage vital signs and the nursing notes.  Pertinent labs & imaging results that were available during my care of the patient were reviewed by me and considered in my medical decision making (see chart for details).  Vitals in triage reviewed, patient is hemodynamically stable.  Staff to remove sutures, as edges were well-approximated and direct site without purulent drainage or leakage.  Staff notified provider of erythema.  Erythema extending 2 cm beyond wound borders and areas tender to palpation.  Will cover with Keflex to prevent bacterial infection of the wound, continue mupirocin  ointment.  Plan of care, follow-up care return precautions given, no questions at this time.     Final Clinical Impressions(s) / UC Diagnoses   Final diagnoses:  Visit for wound check  Visit for suture removal     Discharge Instructions      Take the Keflex twice daily with food for the next 7 days and continue to apply the topical mupirocin  ointment to help prevent wound infection.  If the area starts to drain, you develop fever, or any new concerning symptoms please return to clinic and we will reevaluate the wound site.  ED Prescriptions     Medication Sig Dispense  Auth. Provider   cephALEXin (KEFLEX) 500 MG capsule Take 1 capsule (500 mg total) by mouth 2 (  two) times daily for 7 days. 14 capsule Harlow Lighter, Jd Mccaster  N, FNP      PDMP not reviewed this encounter.   Harlow Lighter, Yona Kosek  N, FNP 09/12/23 1216

## 2023-12-21 IMAGING — MG MM DIGITAL SCREENING BILAT W/ TOMO AND CAD
8 series · 9 of 24 positions shown · non-contrast
Comparison: Previous exam(s).

CLINICAL DATA: Screening.

EXAM:
DIGITAL SCREENING BILATERAL MAMMOGRAM WITH TOMOSYNTHESIS AND CAD
TECHNIQUE: Bilateral screening digital craniocaudal and mediolateral oblique
mammograms were obtained. Bilateral screening digital breast
tomosynthesis was performed. The images were evaluated with
computer-aided detection.

[L CC synth-2D]
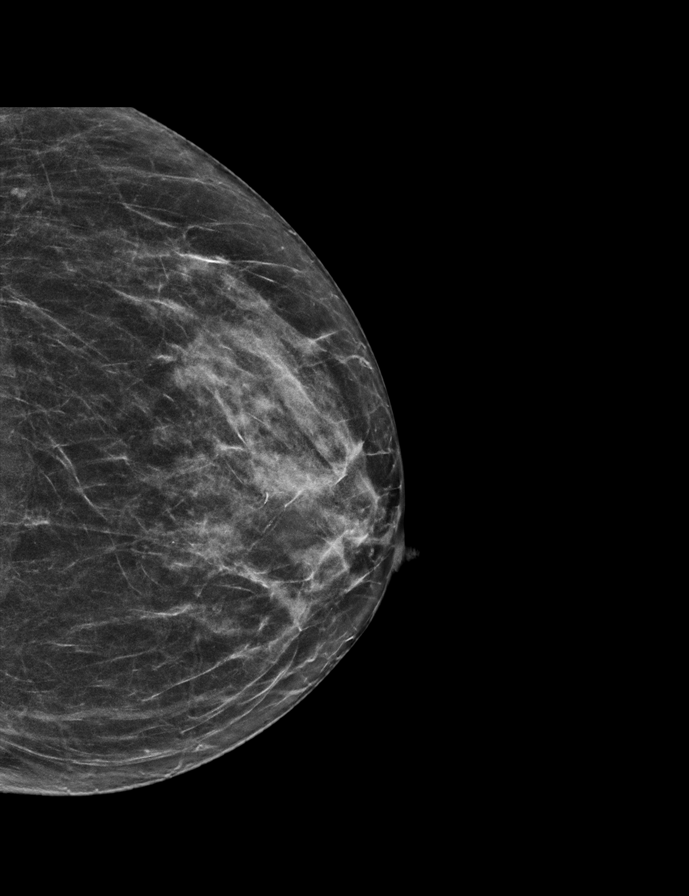

[R MLO synth-2D]
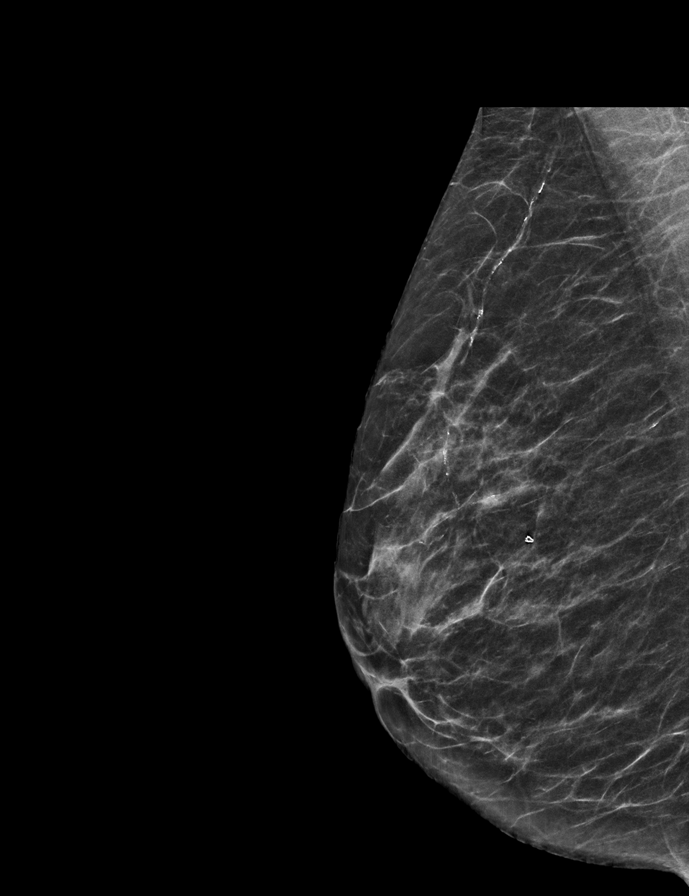

[L MLO synth-2D]
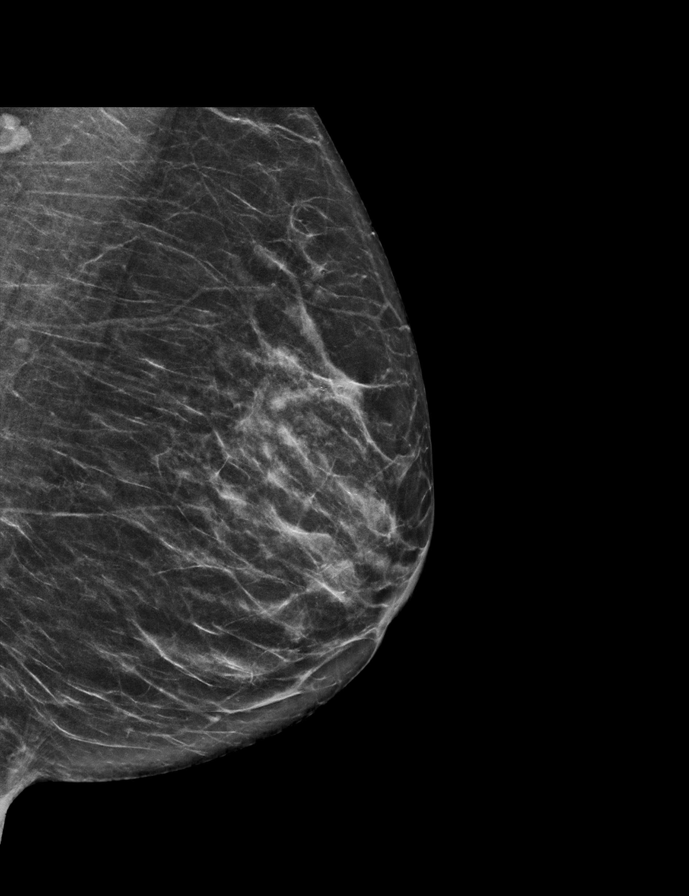

[R CC synth-2D]
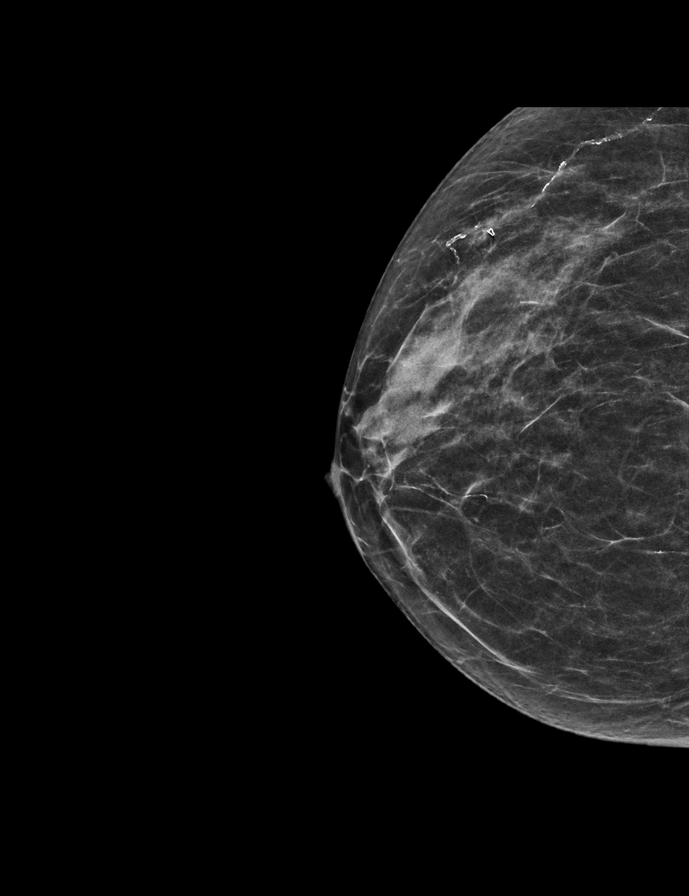

[R CC tomo · 2 of 48 frames shown]
[frame 16/48]
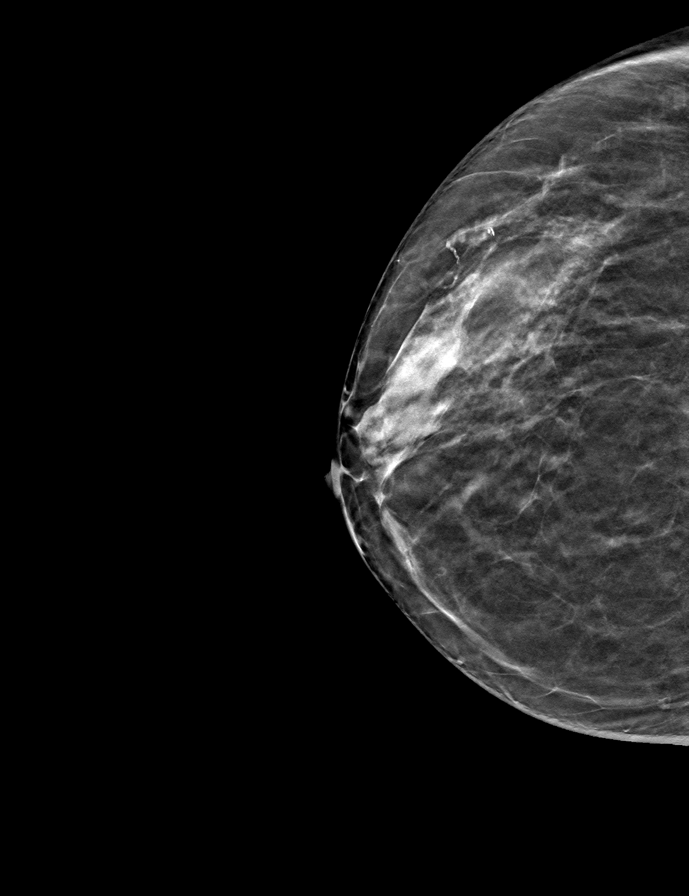
[frame 25/48]
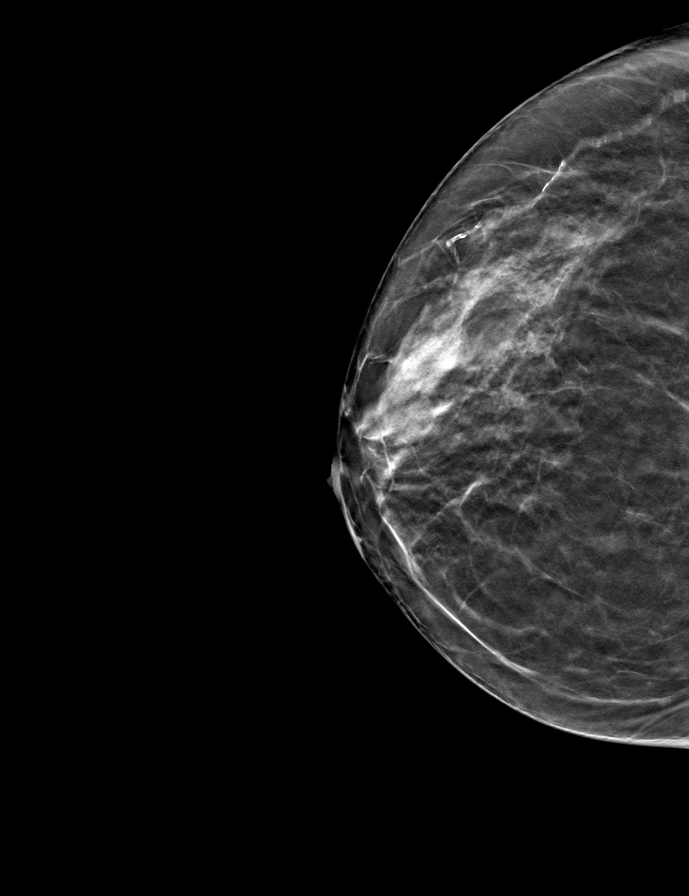

[L CC tomo · tomo slice 25/50.0]
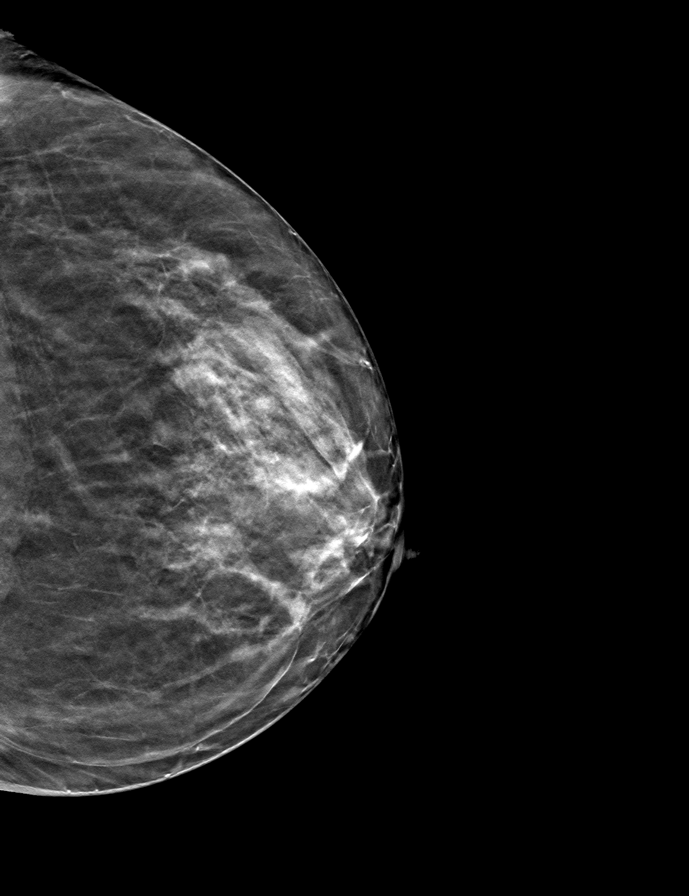

[R MLO tomo · tomo slice 26/51.0]
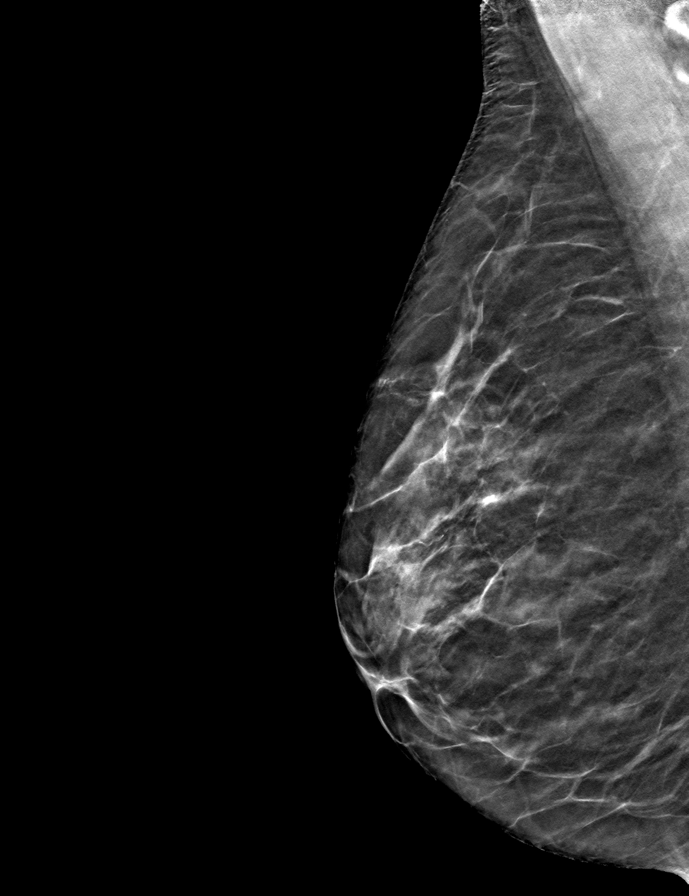

[L MLO tomo · tomo slice 28/55.0]
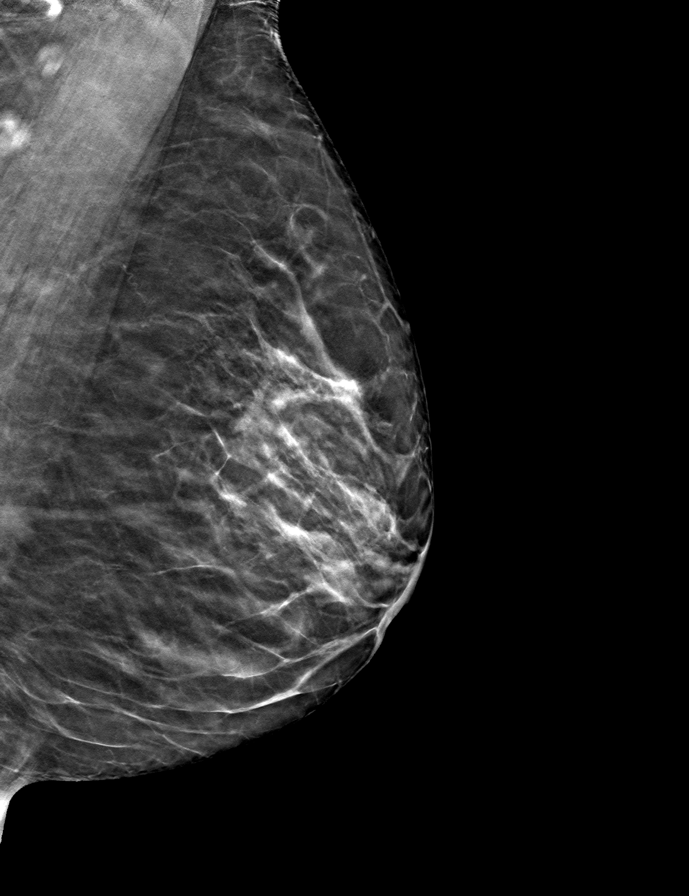

[9 of 24 positions shown; findings below may reference images not displayed]

ACR Breast Density Category c: The breast tissue is heterogeneously
dense, which may obscure small masses.
FINDINGS: There are no findings suspicious for malignancy.
IMPRESSION: No mammographic evidence of malignancy. A result letter of this
screening mammogram will be mailed directly to the patient.

RECOMMENDATION:
Screening mammogram in one year. (Code:Q3-W-BC3)

BI-RADS CATEGORY  1: Negative.

## 2024-01-20 ENCOUNTER — Emergency Department (HOSPITAL_BASED_OUTPATIENT_CLINIC_OR_DEPARTMENT_OTHER)
Admission: EM | Admit: 2024-01-20 | Discharge: 2024-01-20 | Disposition: A | Discharge status: Home / Self Care | Attending: Emergency Medicine | Admitting: Emergency Medicine

## 2024-01-20 ENCOUNTER — Encounter (HOSPITAL_BASED_OUTPATIENT_CLINIC_OR_DEPARTMENT_OTHER): Payer: Self-pay

## 2024-01-20 ENCOUNTER — Emergency Department (HOSPITAL_BASED_OUTPATIENT_CLINIC_OR_DEPARTMENT_OTHER)

## 2024-01-20 ENCOUNTER — Ambulatory Visit: Admission: EM | Admit: 2024-01-20 | Discharge: 2024-01-20 | Disposition: A | Discharge status: Home / Self Care

## 2024-01-20 ENCOUNTER — Other Ambulatory Visit: Payer: Self-pay

## 2024-01-20 DIAGNOSIS — S0990XA Unspecified injury of head, initial encounter: Secondary | ICD-10-CM

## 2024-01-20 DIAGNOSIS — W1789XA Other fall from one level to another, initial encounter: Secondary | ICD-10-CM

## 2024-01-20 NOTE — ED Triage Notes (Signed)
 Pt sent by UC to have imaging done after falling off of a 32ft stage 2 days ago and hit her head.  (-) LOC (-) thinners Pt awake and alert, in NAD in triage. Denies any headache resting.

## 2024-01-20 NOTE — ED Provider Notes (Addendum)
 Lori Valdez is a 89 y.o. female presenting with her friend who contributes to the history for chief complaint of follow-up from 3 foot high stage 2 days ago on January 18, 2024.  One of the performers on the stage accidentally ran into the patient after she was twirling and the patient fell off of the stage and onto her right shoulder.  She hit her head around the right occipital portion of the head on the floor.  She did not pass out and did not become nauseous or vomit after the injury.  She does not take blood thinning medications. Denies headache, dizziness, visual disturbance, unilateral extremity weakness, and paresthesias.  Ambulatory with steady gait without difficulty.  No memory changes.  She is experiencing a little bit of neck pain that is worse with head movement. She went to the gym this morning to workout.  Nontender to palpation of the C-spine, T-spine, and L-spine.  Normal ROM of the neck.  Ambulatory with steady gait without difficulty.  5/5 strength to bilateral upper extremities and lower extremities with resistance.  She is tender to palpation over the superficial right occipital scalp.  I am unable to fully palpate a fluctuant hematoma to the scalp and there are no lacerations/abrasions of the scalp.  Recommend further workup and evaluation in the emergency department setting to rule out acute intracranial abnormality based on [place] head CT guidelines.  Discussed clinical concerns/exam findings leading to recommendation for further workup in the ER setting and risks of deferring ER visit with patient/family. Patient/family express understanding and agreement with plan, discharged to ER via private car.   Enedelia Dorna HERO, [place] 01/20/24 1524
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
## 2024-01-20 NOTE — Discharge Instructions (Signed)
Please follow-up with your family doctor in the office. °
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
## 2024-02-14 ENCOUNTER — Other Ambulatory Visit: Payer: Self-pay | Admitting: Family Medicine

## 2024-02-14 DIAGNOSIS — E7800 Pure hypercholesterolemia, unspecified: Secondary | ICD-10-CM

## 2024-03-24 ENCOUNTER — Other Ambulatory Visit: Payer: Self-pay | Admitting: Family Medicine

## 2024-03-24 DIAGNOSIS — E7800 Pure hypercholesterolemia, unspecified: Secondary | ICD-10-CM

## 2024-03-27 NOTE — Telephone Encounter (Signed)
 Patient overdue for an office appointment. Sent a MyChart message and tried calling and could not leave a voice message.

## 2024-04-29 ENCOUNTER — Other Ambulatory Visit: Payer: Self-pay | Admitting: Family Medicine

## 2024-04-29 DIAGNOSIS — E7800 Pure hypercholesterolemia, unspecified: Secondary | ICD-10-CM

## 2024-07-23 ENCOUNTER — Encounter: Admitting: Family Medicine
